# Patient Record
Sex: Female | Born: 1950 | Race: White | Hispanic: No | Smoking: Never smoker
Health system: Southern US, Community
[De-identification: ages and names within clinical notes are randomized; demographics above are authoritative.]

## PROBLEM LIST (undated history)

## (undated) DIAGNOSIS — G709 Myoneural disorder, unspecified: Secondary | ICD-10-CM

## (undated) DIAGNOSIS — F32A Depression, unspecified: Secondary | ICD-10-CM

## (undated) DIAGNOSIS — M1990 Unspecified osteoarthritis, unspecified site: Secondary | ICD-10-CM

## (undated) DIAGNOSIS — F329 Major depressive disorder, single episode, unspecified: Secondary | ICD-10-CM

## (undated) DIAGNOSIS — T7840XA Allergy, unspecified, initial encounter: Secondary | ICD-10-CM

## (undated) DIAGNOSIS — R51 Headache: Secondary | ICD-10-CM

## (undated) DIAGNOSIS — R519 Headache, unspecified: Secondary | ICD-10-CM

## (undated) DIAGNOSIS — M810 Age-related osteoporosis without current pathological fracture: Secondary | ICD-10-CM

## (undated) DIAGNOSIS — M8580 Other specified disorders of bone density and structure, unspecified site: Secondary | ICD-10-CM

## (undated) HISTORY — DX: Major depressive disorder, single episode, unspecified: F32.9

## (undated) HISTORY — DX: Depression, unspecified: F32.A

## (undated) HISTORY — PX: MANDIBLE SURGERY: SHX707

## (undated) HISTORY — DX: Age-related osteoporosis without current pathological fracture: M81.0

## (undated) HISTORY — PX: COLONOSCOPY: SHX174

## (undated) HISTORY — DX: Allergy, unspecified, initial encounter: T78.40XA

## (undated) HISTORY — DX: Headache: R51

## (undated) HISTORY — DX: Headache, unspecified: R51.9

## (undated) HISTORY — DX: Unspecified osteoarthritis, unspecified site: M19.90

## (undated) HISTORY — DX: Myoneural disorder, unspecified: G70.9

---

## 1996-04-15 HISTORY — PX: OTHER SURGICAL HISTORY: SHX169

## 2013-12-17 ENCOUNTER — Other Ambulatory Visit: Payer: Self-pay | Admitting: Physician Assistant

## 2013-12-21 ENCOUNTER — Other Ambulatory Visit: Payer: Self-pay | Admitting: Physician Assistant

## 2013-12-21 DIAGNOSIS — Z1231 Encounter for screening mammogram for malignant neoplasm of breast: Secondary | ICD-10-CM

## 2013-12-31 ENCOUNTER — Ambulatory Visit
Admission: RE | Admit: 2013-12-31 | Discharge: 2013-12-31 | Disposition: A | Discharge status: Home / Self Care | Payer: BC Managed Care – PPO | Source: Ambulatory Visit | Attending: Physician Assistant | Admitting: Physician Assistant

## 2013-12-31 DIAGNOSIS — Z1231 Encounter for screening mammogram for malignant neoplasm of breast: Secondary | ICD-10-CM

## 2016-02-26 ENCOUNTER — Other Ambulatory Visit: Payer: Self-pay | Admitting: Neurosurgery

## 2016-02-26 DIAGNOSIS — Z1231 Encounter for screening mammogram for malignant neoplasm of breast: Secondary | ICD-10-CM

## 2016-03-26 ENCOUNTER — Ambulatory Visit
Admission: RE | Admit: 2016-03-26 | Discharge: 2016-03-26 | Disposition: A | Discharge status: Home / Self Care | Payer: Medicare Other | Source: Ambulatory Visit | Attending: Neurosurgery | Admitting: Neurosurgery

## 2016-03-26 DIAGNOSIS — Z1231 Encounter for screening mammogram for malignant neoplasm of breast: Secondary | ICD-10-CM

## 2016-11-17 DIAGNOSIS — J3089 Other allergic rhinitis: Secondary | ICD-10-CM | POA: Diagnosis not present

## 2016-11-17 DIAGNOSIS — J029 Acute pharyngitis, unspecified: Secondary | ICD-10-CM | POA: Diagnosis not present

## 2017-01-11 DIAGNOSIS — Z23 Encounter for immunization: Secondary | ICD-10-CM | POA: Diagnosis not present

## 2017-03-17 DIAGNOSIS — H16223 Keratoconjunctivitis sicca, not specified as Sjogren's, bilateral: Secondary | ICD-10-CM | POA: Diagnosis not present

## 2017-03-20 ENCOUNTER — Encounter: Payer: Self-pay | Admitting: Family Medicine

## 2017-03-20 ENCOUNTER — Ambulatory Visit (INDEPENDENT_AMBULATORY_CARE_PROVIDER_SITE_OTHER): Payer: Medicare Other | Admitting: Family Medicine

## 2017-03-20 VITALS — BP 108/72 | HR 95 | Temp 97.8°F | Ht 68.0 in | Wt 163.5 lb

## 2017-03-20 DIAGNOSIS — Z23 Encounter for immunization: Secondary | ICD-10-CM

## 2017-03-20 DIAGNOSIS — Z1231 Encounter for screening mammogram for malignant neoplasm of breast: Secondary | ICD-10-CM | POA: Diagnosis not present

## 2017-03-20 DIAGNOSIS — Z1239 Encounter for other screening for malignant neoplasm of breast: Secondary | ICD-10-CM

## 2017-03-20 DIAGNOSIS — F339 Major depressive disorder, recurrent, unspecified: Secondary | ICD-10-CM | POA: Diagnosis not present

## 2017-03-20 MED ORDER — VENLAFAXINE HCL ER 150 MG PO CP24: 150.0000 mg | ORAL_CAPSULE | Freq: Every day | ORAL | 3 refills | Status: DC

## 2017-03-20 NOTE — Progress Notes (Signed)
Chief Complaint  Patient presents with  . Establish Care       New Patient Visit SUBJECTIVE: HPI: Paige Patel is an 66 y.o.female who is being seen for establishing care.  The patient was previously seen at [facility] to consolidate at [facility].  The patient has a history of depression.  She is currently being treated with Effexor XR 150 mg daily.  She is compliant and tolerating the medicine well.  She is try to go off the past, however he did not feel very good and went back on it.  She does not wish to go off of it in the future.  No family history of anxiety or depression.  She has a history of collagenous colitis.  She is to see a specialist for this, however she reports she was just given long courses of steroids that she did not feel helped her symptoms.  She has never had a pneumonia vaccine.  She did get a flu vaccine this year.  Her last mammogram was over a year ago.   Past Medical History:  Diagnosis Date  . Allergy   . Depression   . Frequent headaches    Past Surgical History:  Procedure Laterality Date  . OTHER SURGICAL HISTORY  1998   jaw surgery, balanced it   Social History   Socioeconomic History  . Marital status: Married  Tobacco Use  . Smoking status: Never Smoker  . Smokeless tobacco: Never Used  Substance and Sexual Activity  . Alcohol use: Yes    Frequency: Never    Comment: seldom  . Drug use: No   Family History  Problem Relation Age of Onset  . Alzheimer's disease Mother 34  . Cancer Father        Lung  . Hypertension Brother      Current Outpatient Medications:  .  diphenhydrAMINE (BENADRYL) 25 mg capsule, Take 25 mg by mouth at bedtime., Disp: , Rfl:  .  methocarbamol (ROBAXIN) 500 MG tablet, Take 500 mg by mouth as needed for muscle spasms., Disp: , Rfl:  .  omeprazole (PRILOSEC) 10 MG capsule, Take 10 mg by mouth daily., Disp: , Rfl:  .  venlafaxine XR (EFFEXOR-XR) 150 MG 24 hr capsule, Take 1 capsule (150 mg total) by mouth daily  with breakfast., Disp: 90 capsule, Rfl: 3  ROS Cardiovascular: Denies chest pain  Respiratory: Denies dyspnea   OBJECTIVE: BP 108/72 (BP Location: Right Leg, Patient Position: Sitting, Cuff Size: Normal)   Pulse 95   Temp 97.8 F (36.6 C) (Oral)   Ht 5\' 8"  (1.727 m)   Wt 163 lb 8 oz (74.2 kg)   SpO2 98%   BMI 24.86 kg/m   Constitutional: -  VS reviewed -  Well developed, well nourished, appears stated age -  No apparent distress  Psychiatric: -  Oriented to person, place, and time -  Memory intact -  Affect and mood normal -  Fluent conversation, good eye contact -  Judgment and insight age appropriate  Eye: -  Conjunctivae clear, no discharge -  Pupils symmetric, round, reactive to light  ENMT: -  MMM    Pharynx moist, no exudate, no erythema  Neck: -  No gross swelling, no palpable masses -  Thyroid midline, not enlarged, mobile, no palpable masses  Cardiovascular: -  RRR -  No LE edema  Respiratory: -  Normal respiratory effort, no accessory muscle use, no retraction -  Breath sounds equal, no wheezes, no ronchi, no crackles  Musculoskeletal: -  No clubbing, no cyanosis -  Gait normal  Skin: -  No significant lesion on inspection -  Warm and dry to palpation   ASSESSMENT/PLAN: Depression, recurrent (HCC) - Plan: venlafaxine XR (EFFEXOR-XR) 150 MG 24 hr capsule  Screening for breast cancer - Plan: MM DIGITAL SCREENING BILATERAL  Need for vaccination against Streptococcus pneumoniae - Plan: Pneumococcal conjugate vaccine 13-valent  Patient instructed to sign release of records form from her previous PCP. Refill Effexor. Mammogram. First pneumococcal vaccine given.  Next one in 1 year. Patient should return in around 1 mo for CPE, fasting. The patient voiced understanding and agreement to the plan.   [place], DO 03/20/17  9:55 AM

## 2017-03-20 NOTE — Progress Notes (Signed)
Pre visit review using our clinic review tool, if applicable. No additional management support is needed unless otherwise documented below in the visit note. 

## 2017-03-20 NOTE — Patient Instructions (Addendum)
I do not hear a heart murmur.  If you do not hear anything about your mammogram in the next week, call our office and ask for an update.  Come to your next appointment fasting for 9-12 hours for lab work.  Let us know if you need anything.

## 2017-05-07 ENCOUNTER — Ambulatory Visit (INDEPENDENT_AMBULATORY_CARE_PROVIDER_SITE_OTHER): Payer: Medicare Other | Admitting: Family Medicine

## 2017-05-07 ENCOUNTER — Encounter: Payer: Self-pay | Admitting: Family Medicine

## 2017-05-07 VITALS — BP 110/72 | HR 78 | Temp 97.6°F | Ht 68.0 in | Wt 161.2 lb

## 2017-05-07 DIAGNOSIS — Z1159 Encounter for screening for other viral diseases: Secondary | ICD-10-CM | POA: Diagnosis not present

## 2017-05-07 DIAGNOSIS — F329 Major depressive disorder, single episode, unspecified: Secondary | ICD-10-CM

## 2017-05-07 DIAGNOSIS — R61 Generalized hyperhidrosis: Secondary | ICD-10-CM

## 2017-05-07 DIAGNOSIS — Z0000 Encounter for general adult medical examination without abnormal findings: Secondary | ICD-10-CM | POA: Diagnosis not present

## 2017-05-07 DIAGNOSIS — F32A Depression, unspecified: Secondary | ICD-10-CM

## 2017-05-07 DIAGNOSIS — Z23 Encounter for immunization: Secondary | ICD-10-CM

## 2017-05-07 DIAGNOSIS — E785 Hyperlipidemia, unspecified: Secondary | ICD-10-CM | POA: Diagnosis not present

## 2017-05-07 DIAGNOSIS — E2839 Other primary ovarian failure: Secondary | ICD-10-CM | POA: Diagnosis not present

## 2017-05-07 DIAGNOSIS — M722 Plantar fascial fibromatosis: Secondary | ICD-10-CM | POA: Diagnosis not present

## 2017-05-07 LAB — COMPREHENSIVE METABOLIC PANEL
ALBUMIN: 4.7 g/dL (ref 3.5–5.2)
ALK PHOS: 105 U/L (ref 39–117)
ALT: 14 U/L (ref 0–35)
AST: 15 U/L (ref 0–37)
BUN: 12 mg/dL (ref 6–23)
CALCIUM: 10 mg/dL (ref 8.4–10.5)
CO2: 22 mEq/L (ref 19–32)
Chloride: 104 mEq/L (ref 96–112)
Creatinine, Ser: 0.91 mg/dL (ref 0.40–1.20)
GFR: 65.71 mL/min (ref >60.00)
Glucose, Bld: 95 mg/dL (ref 70–99)
POTASSIUM: 4.2 meq/L (ref 3.5–5.1)
Sodium: 140 mEq/L (ref 135–145)
Total Bilirubin: 0.5 mg/dL (ref 0.2–1.2)
Total Protein: 7.6 g/dL (ref 6.0–8.3)

## 2017-05-07 LAB — LIPID PANEL
CHOLESTEROL: 299 mg/dL — AB (ref 0–200)
HDL: 66.5 mg/dL (ref >39.00)
NonHDL: 232.08
TRIGLYCERIDES: 228 mg/dL — AB (ref 0.0–149.0)
Total CHOL/HDL Ratio: 4
VLDL: 45.6 mg/dL — AB (ref 0.0–40.0)

## 2017-05-07 LAB — LDL CHOLESTEROL, DIRECT: Direct LDL: 206 mg/dL

## 2017-05-07 NOTE — Patient Instructions (Addendum)
Try Drysol on the areas that are sweaty. If you are not better, let me know and we will send you to the dermatology team.   If your foot is not better, I want to see you in the office.  If you do not hear anything about your bone density scan in the next 1-2 weeks, call our office and ask for an update.   Plantar Fasciitis Stretches/exercises Do exercises exactly as told by your health care provider and adjust them as directed. It is normal to feel mild stretching, pulling, tightness, or discomfort as you do these exercises, but you should stop right away if you feel sudden pain or your pain gets worse.   Stretching and range of motion exercises These exercises warm up your muscles and joints and improve the movement and flexibility of your foot. These exercises also help to relieve pain.  Exercise A: Plantar fascia stretch 1. Sit with your left / right leg crossed over your opposite knee. 2. Hold your heel with one hand with that thumb near your arch. With your other hand, hold your toes and gently pull them back toward the top of your foot. You should feel a stretch on the bottom of your toes or your foot or both. 3. Hold this stretch for 30 seconds. 4. Slowly release your toes and return to the starting position. Repeat 2 times. Complete this exercise 3 times per week.  Exercise B: Gastroc, standing 1. Stand with your hands against a wall. 2. Extend your left / right leg behind you, and bend your front knee slightly. 3. Keeping your heels on the floor and keeping your back knee straight, shift your weight toward the wall without arching your back. You should feel a gentle stretch in your left / right calf. 4. Hold this position for 30 seconds. Repeat 2 times. Complete this exercise 3 times a week. Exercise C: Soleus, standing 1. Stand with your hands against a wall. 2. Extend your left / right leg behind you, and bend your front knee slightly. 3. Keeping your heels on the floor, bend  your back knee and slightly shift your weight over the back leg. You should feel a gentle stretch deep in your calf. 4. Hold this position for 30 seconds. Repeat 2 times. Complete this exercise 3 times per week. Exercise D: Gastrocsoleus, standing 1. Stand with the ball of your left / right foot on a step. The ball of your foot is on the walking surface, right under your toes. 2. Keep your other foot firmly on the same step. 3. Hold onto the wall or a railing for balance. 4. Slowly lift your other foot, allowing your body weight to press your heel down over the edge of the step. You should feel a stretch in your left / right calf. 5. Hold this position for 30 seconds. 6. Return both feet to the step. 7. Repeat this exercise with a slight bend in your left / right knee. Repeat 2 times with your left / right knee straight and 2times with your left / right knee bent. Complete this exercise 3 times a week.  Balance exercise This exercise builds your balance and strength control of your arch to help take pressure off your plantar fascia. Exercise E: Single leg stand 1. Without shoes, stand near a railing or in a doorway. You may hold onto the railing or door frame as needed. 2. Stand on your left / right foot. Keep your big toe down on  the floor and try to keep your arch lifted. Do not let your foot roll inward. 3. Hold this position for 30 seconds. 4. If this exercise is too easy, you can try it with your eyes closed or while standing on a pillow. Repeat 2 times. Complete this exercise 3 times per week. This information is not intended to replace advice given to you by your health care provider. Make sure you discuss any questions you have with your health care provider. Document Released: 04/01/2005 Document Revised: 12/05/2015 Document Reviewed: 02/13/2015 Elsevier Interactive Patient Education  2017 [employer].

## 2017-05-07 NOTE — Progress Notes (Signed)
Subjective:    Paige Patel is a 67 y.o. female who presents for Medicare Initial preventive examination.  Profusely sweats around forehead. Keeps temp around 65 F at home. No other areas of excessive sweat. Has not tried any therapy thus far otherwise.  +1 yr hx of R heel pain. Thinks she may have a spur. Does some exercises at home. Worse after she walks for a period of time. She has supportive shoes.   Preventive Screening-Counseling & Management  Tobacco Social History   Tobacco Use  Smoking Status Never Smoker  Smokeless Tobacco Never Used     Problems Prior to Visit 1. Depression 2. Hyperlipidemia  Current Problems (verified) Patient Active Problem List   Diagnosis Date Noted  . Estrogen deficiency 05/07/2017  . Depression     Medications Prior to Visit Current Outpatient Medications on File Prior to Visit  Medication Sig Dispense Refill  . diphenhydrAMINE (BENADRYL) 25 mg capsule Take 25 mg by mouth at bedtime.    . methocarbamol (ROBAXIN) 500 MG tablet Take 500 mg by mouth as needed for muscle spasms.    Marland Kitchen omeprazole (PRILOSEC) 10 MG capsule Take 10 mg by mouth daily.    Marland Kitchen venlafaxine XR (EFFEXOR-XR) 150 MG 24 hr capsule Take 1 capsule (150 mg total) by mouth daily with breakfast. 90 capsule 3   Current Medications (verified) Current Outpatient Medications  Medication Sig Dispense Refill  . diphenhydrAMINE (BENADRYL) 25 mg capsule Take 25 mg by mouth at bedtime.    . methocarbamol (ROBAXIN) 500 MG tablet Take 500 mg by mouth as needed for muscle spasms.    Marland Kitchen omeprazole (PRILOSEC) 10 MG capsule Take 10 mg by mouth daily.    Marland Kitchen venlafaxine XR (EFFEXOR-XR) 150 MG 24 hr capsule Take 1 capsule (150 mg total) by mouth daily with breakfast. 90 capsule 3   Allergies (verified) Patient has no allergy information on record.   PAST HISTORY  Family History Family History  Problem Relation Age of Onset  . Alzheimer's disease Mother 10  . Cancer Father        Lung  .  Hypertension Brother    Social History Social History   Tobacco Use  . Smoking status: Never Smoker  . Smokeless tobacco: Never Used  Substance Use Topics  . Alcohol use: Yes    Frequency: Never    Comment: seldom     Are there smokers in your home (other than you)? No  Risk Factors Current exercise habits: walking, physically active at work  Dietary issues discussed: Yes  Cardiac risk factors: advanced age (older than 75 for men, 40 for women) and dyslipidemia.  Depression Screen (Note: if answer to either of the following is "Yes", a more complete depression screening is indicated)   Over the past 2 weeks, have you felt down, depressed or hopeless? No  Over the past 2 weeks, have you felt little interest or pleasure in doing things? No  Have you lost interest or pleasure in daily life? No  Do you often feel hopeless? No  Do you cry easily over simple problems? No  Activities of Daily Living In your present state of health, do you have any difficulty performing the following activities?:  Driving? No Managing money?  No Feeding yourself? No Getting from bed to chair? No Climbing a flight of stairs? No Preparing food and eating?: No Bathing or showering? No Getting dressed: No Getting to the toilet? No Using the toilet:No Moving around from place to place: No  In the past year have you fallen or had a near fall?:No  Are you sexually active?  No  Do you have more than one partner?  No  Hearing Difficulties: No Do you often ask people to speak up or repeat themselves? No Do you experience ringing or noises in your ears? No Do you have difficulty understanding soft or whispered voices? No  Do you feel that you have a problem with memory? No  Do you often misplace items? Yes  Do you feel safe at home?  Yes  Cognitive Testing  Alert? Yes  Normal Appearance?Yes  Oriented to person? Yes  Place? Yes   Time? Yes  Recall of three objects?  Yes  Can perform simple  calculations? Yes  Displays appropriate judgment?Yes  Can read the correct time from a watch face?Yes   Advanced Directives have been discussed with the patient? Yes  List the Names of Other Physician/Practitioners you currently use: 1.  None  Indicate any recent Medical Services you may have received from other than [facility] providers in the past year (date may be approximate).  Immunization History  Administered Date(s) Administered  . Pneumococcal Conjugate-13 03/20/2017  . Td 05/07/2017    Screening Tests Health Maintenance  Topic Date Due  . Hepatitis C Screening  1950-11-12  . TETANUS/TDAP  03/18/1970  . COLONOSCOPY  03/18/2001  . DEXA SCAN  03/18/2016  . PNA vac Low Risk Adult (2 of 2 - PPSV23) 03/20/2018  . MAMMOGRAM  03/26/2018  . INFLUENZA VACCINE  Completed    All answers were reviewed with the patient and necessary referrals were made:  Paige Pal, DO   05/07/2017   History reviewed: allergies, current medications, past family history, past medical history, past social history, past surgical history and problem list  Review of Systems ROS neg except for following   Skin: +itchy area on back intermittently MSK: +TTP over R foot   Objective:     Vision by Snellen chart: right eye:20/20, left eye:20/20  Body mass index is 24.52 kg/m. BP 110/72 (BP Location: Left Arm, Patient Position: Sitting, Cuff Size: Normal)   Pulse 78   Temp 97.6 F (36.4 C) (Oral)   Ht 5\' 8"  (1.727 m)   Wt 161 lb 4 oz (73.1 kg)   SpO2 98%   BMI 24.52 kg/m   General Appearance:    Alert, cooperative, no distress, appears stated age  Head:    Normocephalic, without obvious abnormality, atraumatic  Eyes:    PERRL, conjunctiva/corneas clear, EOM's intact, fundi    benign, both eyes  Ears:    Normal TM's and external ear canals, both ears  Nose:   Nares normal, septum midline, mucosa normal, no drainage    or sinus tenderness  Throat:   Lips, mucosa, and tongue normal;  teeth and gums normal  Neck:   Supple, symmetrical, trachea midline, no adenopathy;    thyroid:  no enlargement/tenderness/nodules; no carotid   bruit or JVD  Back:     Symmetric, no curvature, ROM normal, no CVA tenderness  Lungs:     Clear to auscultation bilaterally, respirations unlabored  Chest Wall:    No tenderness or deformity   Heart:    Regular rate and rhythm, S1 and S2 normal, no murmur, rub   or gallop     Abdomen:     Soft, non-tender, bowel sounds active all four quadrants,    no masses, no organomegaly  Genitalia:    Deferred  Rectal:  Not done  Extremities:   Extremities normal, atraumatic, no cyanosis or edema; +TTP over prox insertion of R plantar fascia  Pulses:   2+ and symmetric all extremities  Skin:   Skin color, texture, turgor normal, no rashes or lesions  Lymph nodes:   Cervical, supraclavicular, and axillary nodes normal  Neurologic:   CNII-XII intact, normal strength, sensation and reflexes    throughout        Assessment:     Medicare annual wellness visit, initial  Depression, unspecified depression type  Encounter for hepatitis C screening test for low risk patient - Plan: Hepatitis C antibody  Estrogen deficiency - Plan: DG Bone Density  Dyslipidemia - Plan: Comprehensive metabolic panel, Lipid panel  Plantar fasciitis  Hyperhidrosis  Need for tetanus booster - Plan: Td vaccine greater than or equal to 7yo preservative free IM       Plan:      Hyperhidrosis: Try Drysol, if no better will refer to derm Plantar fasciitis: [place] sock, Power Step, Home stretches/exercises; if not better, let us know  During the course of the visit the patient was educated and counseled about appropriate screening and preventive services including:    Pneumococcal vaccine   Influenza vaccine  Td vaccine  Screening mammography  Bone densitometry screening  Colorectal cancer screening  Shingles vaccine  Diet review for nutrition  referral? Not Indicated    Patient Instructions (the written plan) was given to the patient.  Medicare Attestation I have personally reviewed: The patient's medical and social history Their use of alcohol, tobacco or illicit drugs Their current medications and supplements The patient's functional ability including ADLs,fall risks, home safety risks, cognitive, and hearing and visual impairment Diet and physical activities Evidence for depression or mood disorders  The patient's weight, height, BMI, and visual acuity have been recorded in the chart.  I have made referrals, counseling, and provided education to the patient based on review of the above and I have provided the patient with a written personalized care plan for preventive services.     [place], DO   05/07/2017

## 2017-05-07 NOTE — Progress Notes (Signed)
Pre visit review using our clinic review tool, if applicable. No additional management support is needed unless otherwise documented below in the visit note. 

## 2017-05-08 ENCOUNTER — Other Ambulatory Visit: Payer: Self-pay | Admitting: Family Medicine

## 2017-05-08 LAB — HEPATITIS C ANTIBODY
Hepatitis C Ab: NONREACTIVE
SIGNAL TO CUT-OFF: 0.01 (ref <1.00)

## 2017-05-08 MED ORDER — ATORVASTATIN CALCIUM 40 MG PO TABS: 40.0000 mg | ORAL_TABLET | Freq: Every day | ORAL | 1 refills | Status: DC

## 2017-05-09 ENCOUNTER — Ambulatory Visit (HOSPITAL_BASED_OUTPATIENT_CLINIC_OR_DEPARTMENT_OTHER)
Admission: RE | Admit: 2017-05-09 | Discharge: 2017-05-09 | Disposition: A | Discharge status: Home / Self Care | Payer: Medicare Other | Source: Ambulatory Visit | Attending: Family Medicine | Admitting: Family Medicine

## 2017-05-09 ENCOUNTER — Encounter (HOSPITAL_BASED_OUTPATIENT_CLINIC_OR_DEPARTMENT_OTHER): Payer: Self-pay | Admitting: Radiology

## 2017-05-09 ENCOUNTER — Encounter: Payer: Self-pay | Admitting: Family Medicine

## 2017-05-09 DIAGNOSIS — Z1239 Encounter for other screening for malignant neoplasm of breast: Secondary | ICD-10-CM

## 2017-05-09 DIAGNOSIS — Z780 Asymptomatic menopausal state: Secondary | ICD-10-CM | POA: Diagnosis not present

## 2017-05-09 DIAGNOSIS — M85852 Other specified disorders of bone density and structure, left thigh: Secondary | ICD-10-CM | POA: Insufficient documentation

## 2017-05-09 DIAGNOSIS — E2839 Other primary ovarian failure: Secondary | ICD-10-CM | POA: Insufficient documentation

## 2017-05-09 DIAGNOSIS — M8588 Other specified disorders of bone density and structure, other site: Secondary | ICD-10-CM | POA: Insufficient documentation

## 2017-05-09 DIAGNOSIS — Z1231 Encounter for screening mammogram for malignant neoplasm of breast: Secondary | ICD-10-CM | POA: Diagnosis not present

## 2017-05-09 DIAGNOSIS — M8589 Other specified disorders of bone density and structure, multiple sites: Secondary | ICD-10-CM | POA: Diagnosis not present

## 2017-05-09 DIAGNOSIS — M8580 Other specified disorders of bone density and structure, unspecified site: Secondary | ICD-10-CM | POA: Insufficient documentation

## 2017-05-09 HISTORY — DX: Other specified disorders of bone density and structure, unspecified site: M85.80

## 2017-05-12 ENCOUNTER — Other Ambulatory Visit: Payer: Self-pay | Admitting: Family Medicine

## 2017-05-12 DIAGNOSIS — R928 Other abnormal and inconclusive findings on diagnostic imaging of breast: Secondary | ICD-10-CM

## 2017-05-15 ENCOUNTER — Telehealth: Payer: Self-pay | Admitting: *Deleted

## 2017-05-15 NOTE — Telephone Encounter (Signed)
Received Physician Orders from [place]; forwarded to provider/SLS 01/31

## 2017-05-16 ENCOUNTER — Ambulatory Visit
Admission: RE | Admit: 2017-05-16 | Discharge: 2017-05-16 | Disposition: A | Discharge status: Home / Self Care | Payer: Medicare Other | Source: Ambulatory Visit | Attending: Family Medicine | Admitting: Family Medicine

## 2017-05-16 ENCOUNTER — Ambulatory Visit: Payer: Medicare Other

## 2017-05-16 DIAGNOSIS — N6489 Other specified disorders of breast: Secondary | ICD-10-CM | POA: Diagnosis not present

## 2017-05-16 DIAGNOSIS — R928 Other abnormal and inconclusive findings on diagnostic imaging of breast: Secondary | ICD-10-CM | POA: Diagnosis not present

## 2017-07-10 ENCOUNTER — Encounter: Payer: Self-pay | Admitting: Family Medicine

## 2017-07-11 MED ORDER — METHOCARBAMOL 500 MG PO TABS: 500.0000 mg | ORAL_TABLET | ORAL | 1 refills | Status: DC | PRN

## 2017-10-12 ENCOUNTER — Other Ambulatory Visit: Payer: Self-pay | Admitting: Family Medicine

## 2017-11-12 ENCOUNTER — Encounter: Payer: Self-pay | Admitting: Family Medicine

## 2017-11-12 ENCOUNTER — Ambulatory Visit: Payer: Medicare Other | Admitting: Family Medicine

## 2017-11-12 ENCOUNTER — Other Ambulatory Visit: Payer: Self-pay | Admitting: Family Medicine

## 2017-11-12 VITALS — BP 118/78 | HR 78 | Temp 98.1°F | Ht 67.5 in | Wt 163.0 lb

## 2017-11-12 DIAGNOSIS — F329 Major depressive disorder, single episode, unspecified: Secondary | ICD-10-CM

## 2017-11-12 DIAGNOSIS — E785 Hyperlipidemia, unspecified: Secondary | ICD-10-CM

## 2017-11-12 DIAGNOSIS — R748 Abnormal levels of other serum enzymes: Secondary | ICD-10-CM

## 2017-11-12 DIAGNOSIS — Z23 Encounter for immunization: Secondary | ICD-10-CM

## 2017-11-12 DIAGNOSIS — F32A Depression, unspecified: Secondary | ICD-10-CM

## 2017-11-12 LAB — LIPID PANEL
CHOL/HDL RATIO: 4
Cholesterol: 272 mg/dL — ABNORMAL HIGH (ref 0–200)
HDL: 73.5 mg/dL (ref >39.00)
LDL CALC: 162 mg/dL — AB (ref 0–99)
NonHDL: 198.14
TRIGLYCERIDES: 179 mg/dL — AB (ref 0.0–149.0)
VLDL: 35.8 mg/dL (ref 0.0–40.0)

## 2017-11-12 LAB — COMPREHENSIVE METABOLIC PANEL
ALT: 16 U/L (ref 0–35)
AST: 18 U/L (ref 0–37)
Albumin: 4.4 g/dL (ref 3.5–5.2)
Alkaline Phosphatase: 125 U/L — ABNORMAL HIGH (ref 39–117)
BUN: 10 mg/dL (ref 6–23)
CALCIUM: 9.7 mg/dL (ref 8.4–10.5)
CO2: 28 meq/L (ref 19–32)
Chloride: 103 mEq/L (ref 96–112)
Creatinine, Ser: 0.85 mg/dL (ref 0.40–1.20)
GFR: 70.98 mL/min (ref >60.00)
Glucose, Bld: 94 mg/dL (ref 70–99)
Potassium: 4.3 mEq/L (ref 3.5–5.1)
Sodium: 139 mEq/L (ref 135–145)
Total Bilirubin: 0.6 mg/dL (ref 0.2–1.2)
Total Protein: 6.8 g/dL (ref 6.0–8.3)

## 2017-11-12 MED ORDER — ROSUVASTATIN CALCIUM 20 MG PO TABS: 20.0000 mg | ORAL_TABLET | Freq: Every day | ORAL | 3 refills | Status: DC

## 2017-11-12 NOTE — Patient Instructions (Signed)
Keep the diet clean and stay active.  Give us 2-3 business days to get the results of your labs back.   Let us know if you need anything.  

## 2017-11-12 NOTE — Progress Notes (Signed)
Chief Complaint  Patient presents with  . Follow-up    Subjective Paige Patel presents for f/u anxiety/depression.  She is currently being treated with Effexor.  Reports doing well since treatment. No thoughts of harming self or others. No self-medication with alcohol, prescription drugs or illicit drugs.  Hyperlipidemia Dx with LDL >190 on labs, started on Lipitor 40 mg/d. Unable to take 2/2 AE's. Diet is healthy. Walks daily. No CP or SOB. +famhx of increased cholesterol.   ROS Psych: No homicidal or suicidal thoughts  Past Medical History:  Diagnosis Date  . Allergy   . Depression   . Frequent headaches   . Osteopenia      Exam BP 118/78 (BP Location: Left Arm, Patient Position: Sitting, Cuff Size: Normal)   Pulse 78   Temp 98.1 F (36.7 C) (Oral)   Ht 5' 7.5" (1.715 m)   Wt 163 lb (73.9 kg)   SpO2 97%   BMI 25.15 kg/m  General:  well developed, well nourished, in no apparent distress Neck: neck supple without adenopathy, thyromegaly, or masses Lungs:  clear to auscultation, breath sounds equal bilaterally, no respiratory distress Cardio:  regular rate and rhythm without murmurs, heart sounds without clicks or rubs Psych: well oriented with normal range of affect and age-appropriate judgement/insight, alert and oriented x4.  Assessment and Plan  Dyslipidemia - Plan: Comprehensive metabolic panel, Lipid panel  Depression, unspecified depression type  Need for shingles vaccine - Plan: Varicella-zoster vaccine IM (Shingrix)  Need for vaccination against Streptococcus pneumoniae - Plan: Pneumococcal conjugate vaccine 13-valent IM  Cont current meds. Will likely rec Pravachol vs Crestor. F/u in 6 mo. The patient voiced understanding and agreement to the plan.  [place], DO 11/12/17 9:34 AM

## 2017-11-12 NOTE — Progress Notes (Signed)
Pre visit review using our clinic review tool, if applicable. No additional management support is needed unless otherwise documented below in the visit note. 

## 2017-11-24 ENCOUNTER — Other Ambulatory Visit (INDEPENDENT_AMBULATORY_CARE_PROVIDER_SITE_OTHER): Payer: Medicare Other

## 2017-11-24 DIAGNOSIS — R748 Abnormal levels of other serum enzymes: Secondary | ICD-10-CM | POA: Diagnosis not present

## 2017-11-24 LAB — HEPATIC FUNCTION PANEL
ALT: 15 U/L (ref 0–35)
AST: 18 U/L (ref 0–37)
Albumin: 4.1 g/dL (ref 3.5–5.2)
Alkaline Phosphatase: 111 U/L (ref 39–117)
BILIRUBIN DIRECT: 0.1 mg/dL (ref 0.0–0.3)
BILIRUBIN TOTAL: 0.4 mg/dL (ref 0.2–1.2)
Total Protein: 6.4 g/dL (ref 6.0–8.3)

## 2017-12-14 ENCOUNTER — Other Ambulatory Visit: Payer: Self-pay | Admitting: Family Medicine

## 2018-02-17 ENCOUNTER — Ambulatory Visit (INDEPENDENT_AMBULATORY_CARE_PROVIDER_SITE_OTHER): Payer: Medicare Other

## 2018-02-17 DIAGNOSIS — Z23 Encounter for immunization: Secondary | ICD-10-CM | POA: Diagnosis not present

## 2018-03-06 ENCOUNTER — Other Ambulatory Visit: Payer: Self-pay | Admitting: Family Medicine

## 2018-03-06 DIAGNOSIS — F339 Major depressive disorder, recurrent, unspecified: Secondary | ICD-10-CM

## 2018-03-23 ENCOUNTER — Other Ambulatory Visit: Payer: Self-pay | Admitting: Family Medicine

## 2018-05-15 ENCOUNTER — Ambulatory Visit (INDEPENDENT_AMBULATORY_CARE_PROVIDER_SITE_OTHER): Payer: Medicare Other | Admitting: Family Medicine

## 2018-05-15 ENCOUNTER — Encounter: Payer: Self-pay | Admitting: Family Medicine

## 2018-05-15 VITALS — BP 102/68 | HR 88 | Temp 98.0°F | Ht 68.0 in | Wt 170.0 lb

## 2018-05-15 DIAGNOSIS — F329 Major depressive disorder, single episode, unspecified: Secondary | ICD-10-CM

## 2018-05-15 DIAGNOSIS — Z0000 Encounter for general adult medical examination without abnormal findings: Secondary | ICD-10-CM | POA: Diagnosis not present

## 2018-05-15 DIAGNOSIS — Z1211 Encounter for screening for malignant neoplasm of colon: Secondary | ICD-10-CM

## 2018-05-15 DIAGNOSIS — F32A Depression, unspecified: Secondary | ICD-10-CM

## 2018-05-15 DIAGNOSIS — E785 Hyperlipidemia, unspecified: Secondary | ICD-10-CM

## 2018-05-15 LAB — COMPREHENSIVE METABOLIC PANEL
ALK PHOS: 102 U/L (ref 39–117)
ALT: 16 U/L (ref 0–35)
AST: 17 U/L (ref 0–37)
Albumin: 4.3 g/dL (ref 3.5–5.2)
BUN: 13 mg/dL (ref 6–23)
CALCIUM: 9.8 mg/dL (ref 8.4–10.5)
CO2: 25 meq/L (ref 19–32)
Chloride: 103 mEq/L (ref 96–112)
Creatinine, Ser: 0.8 mg/dL (ref 0.40–1.20)
GFR: 71.51 mL/min (ref >60.00)
GLUCOSE: 91 mg/dL (ref 70–99)
POTASSIUM: 4.3 meq/L (ref 3.5–5.1)
Sodium: 138 mEq/L (ref 135–145)
Total Bilirubin: 0.7 mg/dL (ref 0.2–1.2)
Total Protein: 6.4 g/dL (ref 6.0–8.3)

## 2018-05-15 LAB — LIPID PANEL
Cholesterol: 163 mg/dL (ref 0–200)
HDL: 57.3 mg/dL (ref >39.00)
LDL Cholesterol: 73 mg/dL (ref 0–99)
NonHDL: 105.21
TRIGLYCERIDES: 162 mg/dL — AB (ref 0.0–149.0)
Total CHOL/HDL Ratio: 3
VLDL: 32.4 mg/dL (ref 0.0–40.0)

## 2018-05-15 MED ORDER — METHOCARBAMOL 500 MG PO TABS: 500.0000 mg | ORAL_TABLET | ORAL | 1 refills | Status: DC | PRN

## 2018-05-15 NOTE — Progress Notes (Signed)
Subjective:   Paige Patel is a 68 y.o. female who presents for Medicare Annual (Subsequent) preventive examination.  Hyperlipidemia Patient presents for dyslipidemia follow up. Currently being treated with Crestor 20 mg/d and compliance with treatment thus far has been good. She denies myalgias. She is adhering to a healthy. Exercise: walking, active at home The patient is not known to have coexisting coronary artery disease.  Depression Currently doing well on venlafaxine, no AE's. Not following with counselor or psychologist. It also helps with her vertigo.   Review of Systems:  MSK: +intermittent toe pain Const: No fevers Endo: slight wt gain GU: No urinary complaints Skin: No rashes Neuro: No vertigo Psych: No depression Lungs: No shortness of breath Cardiac: No chest pain GI: No bowel changes       Objective:     Vitals: BP 102/68 (BP Location: Left Arm, Patient Position: Sitting, Cuff Size: Normal)   Pulse 88   Temp 98 F (36.7 C) (Oral)   Ht 5\' 8"  (1.727 m)   Wt 170 lb (77.1 kg)   SpO2 98%   BMI 25.85 kg/m   Body mass index is 25.85 kg/m.   Tobacco Social History   Tobacco Use  Smoking Status Never Smoker  Smokeless Tobacco Never Used     Counseling given: Not Answered     Past Medical History:  Diagnosis Date  . Allergy   . Depression   . Frequent headaches   . Osteopenia    Past Surgical History:  Procedure Laterality Date  . OTHER SURGICAL HISTORY  1998   jaw surgery, balanced it   Family History  Problem Relation Age of Onset  . Alzheimer's disease Mother 29  . High Cholesterol Mother   . Cancer Father        Lung  . High Cholesterol Father   . Hypertension Brother   . Breast cancer Paternal Aunt      Outpatient Encounter Medications as of 05/15/2018  Medication Sig  . methocarbamol (ROBAXIN) 500 MG tablet Take 1 tablet (500 mg total) by mouth as needed for muscle spasms.  Marland Kitchen omeprazole (PRILOSEC) 10 MG capsule Take 10 mg  by mouth daily.  . rosuvastatin (CRESTOR) 20 MG tablet Take 1 tablet (20 mg total) by mouth daily.  Marland Kitchen venlafaxine XR (EFFEXOR-XR) 150 MG 24 hr capsule TAKE 1 CAPSULE DAILY WITH BREAKFAST    Activities of Daily Living No flowsheet data found.  Patient Care Team: [name], DO as PCP - General (Family Medicine)    Assessment:   This is a routine wellness examination for [name].  Exercise Activities and Dietary recommendations    Goals   None     Fall Risk Is the patient's home free of loose throw rugs in walkways, pet beds, electrical cords, etc?   no      Grab bars in the bathroom? no      Handrails on the stairs?   Doesn't need to      Adequate lighting?   yes  Timed Get Up and Go performed: 7 s  Depression Screen Controlled on medication  Cognitive Function A&O x 4  Immunization History  Administered Date(s) Administered  . Influenza, High Dose Seasonal PF 02/17/2018  . Pneumococcal Conjugate-13 03/20/2017, 11/12/2017  . Td 05/07/2017  . Zoster Recombinat (Shingrix) 11/12/2017, 02/27/2018    Qualifies for Shingles Vaccine? Yes  Screening Tests Health Maintenance  Topic Date Due  . COLONOSCOPY  03/18/2001  . PNA vac Low Risk Adult (  2 of 2 - PPSV23) 11/13/2018  . MAMMOGRAM  05/17/2019  . TETANUS/TDAP  05/08/2027  . INFLUENZA VACCINE  Completed  . DEXA SCAN  Completed  . Hepatitis C Screening  Completed    Cancer Screenings: Lung: Low Dose CT Chest recommended if Age 68-80 years, 30 pack-year currently smoking OR have quit w/in 15 years. Patient does not qualify. Breast:  Up to date on Mammogram? Yes   Up to date of Bone Density/Dexa? Yes Colorectal: Ordered  Additional Screenings:: Hepatitis C Screening: Done     Plan:  I have personally reviewed and noted the following in the patient's chart:   . Medical and social history . Use of alcohol, tobacco or illicit drugs  . Current medications and supplements . Functional ability and  status . Nutritional status . Physical activity . Advanced directives . List of other physicians . Hospitalizations, surgeries, and ER visits in previous 12 months . Vitals . Screenings to include cognitive, depression, and falls . Referrals and appointments  In addition, I have reviewed and discussed with patient certain preventive protocols, quality metrics, and best practice recommendations. A written personalized care plan for preventive services as well as general preventive health recommendations were provided to patient.     [place], DO  05/15/2018

## 2018-05-15 NOTE — Patient Instructions (Addendum)
If you do not hear anything about your referral in the next 1-2 weeks, call our office and ask for an update.  Give us 2-3 business days to get the results of your labs back.   Keep the diet clean and stay active.  Let us know if you need anything.  

## 2018-06-17 ENCOUNTER — Other Ambulatory Visit: Payer: Self-pay | Admitting: Family Medicine

## 2018-06-17 DIAGNOSIS — Z1231 Encounter for screening mammogram for malignant neoplasm of breast: Secondary | ICD-10-CM

## 2018-06-24 ENCOUNTER — Encounter: Payer: Self-pay | Admitting: Family Medicine

## 2018-08-11 ENCOUNTER — Ambulatory Visit: Payer: Medicare Other

## 2018-10-22 ENCOUNTER — Other Ambulatory Visit: Payer: Self-pay | Admitting: Family Medicine

## 2018-11-13 ENCOUNTER — Ambulatory Visit (INDEPENDENT_AMBULATORY_CARE_PROVIDER_SITE_OTHER): Payer: Medicare Other | Admitting: Family Medicine

## 2018-11-13 ENCOUNTER — Other Ambulatory Visit: Payer: Self-pay

## 2018-11-13 ENCOUNTER — Encounter: Payer: Self-pay | Admitting: Family Medicine

## 2018-11-13 VITALS — BP 120/80 | HR 84 | Temp 98.0°F | Ht 68.0 in | Wt 168.5 lb

## 2018-11-13 DIAGNOSIS — F32A Depression, unspecified: Secondary | ICD-10-CM

## 2018-11-13 DIAGNOSIS — E785 Hyperlipidemia, unspecified: Secondary | ICD-10-CM

## 2018-11-13 DIAGNOSIS — F329 Major depressive disorder, single episode, unspecified: Secondary | ICD-10-CM | POA: Diagnosis not present

## 2018-11-13 DIAGNOSIS — M792 Neuralgia and neuritis, unspecified: Secondary | ICD-10-CM

## 2018-11-13 MED ORDER — ROSUVASTATIN CALCIUM 20 MG PO TABS: 20.0000 mg | ORAL_TABLET | Freq: Every day | ORAL | 3 refills | Status: DC

## 2018-11-13 NOTE — Patient Instructions (Signed)
Keep the diet clean and stay active.  I think things are relatively OK with your feet for now.   If things bother you enough to start a medicine, let me know.  Let us know if you need anything.

## 2018-11-13 NOTE — Progress Notes (Signed)
Chief Complaint  Patient presents with  . Follow-up  . Foot Swelling    redness and painful    Subjective: Hyperlipidemia Patient presents for Hyperlipidemia follow up. Currently taking Crestor 20 mg/d and compliance with treatment thus far has been good. She denies myalgias. She is adhering to a healthy diet. Exercise: walking The patient is not known to have coexisting coronary artery disease.  Mood is doing well with the Effexor 150 mg/d. No AE's, actually does worse when she comes off. Compliant.  Had been having swelling/redness in feet for a period of time. 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 No pain with ambulation. No current swelling/redness. Feels things are better from that regard. No CP or SOB.   ROS: Heart: Denies chest pain Lungs: Denies SOB  Past Medical History:  Diagnosis Date  . Allergy   . Depression   . Frequent headaches   . Osteopenia     Objective: BP 120/80 (BP Location: Left Arm, Patient Position: Sitting, Cuff Size: Normal)   Pulse 84   Temp 98 F (36.7 C) (Oral)   Ht 5\' 8"  (1.727 m)   Wt 168 lb 8 oz (76.4 kg)   SpO2 98%   BMI 25.62 kg/m  General: Awake, appears stated age HEENT: MMM Heart: RRR, no LE edema, no bruits, DP pulses 1+ bl.  Neuro: Decreased sensation to pinprick to midfoot on L, up to hindfoot on R.  Skin: No lesions/erythema/execessive warmth.  Lungs: CTAB, no rales, wheezes or rhonchi. No accessory muscle use Psych: Age appropriate judgment and insight, normal affect and mood  Assessment and Plan: Dyslipidemia - Plan: Cont statin  Depression, unspecified depression type - Plan: Cont SNRI  Neuropathic pain - Plan: cont SNRI, offered new med but not bothersome enough. May have a component of venous insuff.  Orders as above. F/u in 6 mo for AWV and med ck. The patient voiced understanding and agreement to the plan.  [place], DO 11/13/18  8:22 AM

## 2019-01-27 ENCOUNTER — Ambulatory Visit (INDEPENDENT_AMBULATORY_CARE_PROVIDER_SITE_OTHER): Payer: Medicare Other | Admitting: *Deleted

## 2019-01-27 ENCOUNTER — Other Ambulatory Visit: Payer: Self-pay

## 2019-01-27 DIAGNOSIS — Z23 Encounter for immunization: Secondary | ICD-10-CM

## 2019-01-27 NOTE — Progress Notes (Signed)
Patient here for flu vaccine.  Vaccine given in left deltoid and patient tolerated well. 

## 2019-02-04 ENCOUNTER — Other Ambulatory Visit: Payer: Self-pay | Admitting: Family Medicine

## 2019-03-10 ENCOUNTER — Other Ambulatory Visit: Payer: Self-pay

## 2019-04-19 ENCOUNTER — Other Ambulatory Visit: Payer: Self-pay | Admitting: Family Medicine

## 2019-04-19 DIAGNOSIS — F339 Major depressive disorder, recurrent, unspecified: Secondary | ICD-10-CM

## 2019-05-14 NOTE — Progress Notes (Signed)
Virtual Visit via Video Note  I connected with patient on 05/17/19 at  8:00 AM EST by audio enabled telemedicine application and verified that I am speaking with the correct person using two identifiers.   THIS ENCOUNTER IS A VIRTUAL VISIT DUE TO COVID-19 - PATIENT WAS NOT SEEN IN THE OFFICE. PATIENT HAS CONSENTED TO VIRTUAL VISIT / TELEMEDICINE VISIT   Location of patient: home  Location of provider: office  I discussed the limitations of evaluation and management by telemedicine and the availability of in person appointments. The patient expressed understanding and agreed to proceed.   Subjective:   Paige Patel is a 69 y.o. female who presents for Medicare Annual (Subsequent) preventive examination.  Review of Systems:  Cardiac Risk Factors include: advanced age (>59men, >45 women);dyslipidemia Home Safety/Smoke Alarms: Feels safe in home. Smoke alarms in place.  Lives w/ husband and 2 dogs in 2 story home. No trouble w/ stairs but does have stair lift. Walk in shower w/ grab bar.    Female:        Mammo- active order       Dexa scan- ordered        CCS- pt reports she will be due in June 2021    Objective:     Vitals: Unable to assess. This visit is enabled though telemedicine due to Covid 19.  Advanced Directives 05/17/2019  Does Patient Have a Medical Advance Directive? No  Would patient like information on creating a medical advance directive? No - Patient declined    Tobacco Social History   Tobacco Use  Smoking Status Never Smoker  Smokeless Tobacco Never Used     Counseling given: Not Answered   Clinical Intake:     Pain : No/denies pain                 Past Medical History:  Diagnosis Date  . Allergy   . Depression   . Frequent headaches   . Osteopenia    Past Surgical History:  Procedure Laterality Date  . OTHER SURGICAL HISTORY  1998   jaw surgery, balanced it   Family History  Problem Relation Age of Onset  . Alzheimer's  disease Mother 93  . High Cholesterol Mother   . Cancer Father        Lung  . High Cholesterol Father   . Hypertension Brother   . Breast cancer Paternal Aunt    Social History   Socioeconomic History  . Marital status: Married    Spouse name: Not on file  . Number of children: Not on file  . Years of education: Not on file  . Highest education level: Not on file  Occupational History  . Not on file  Tobacco Use  . Smoking status: Never Smoker  . Smokeless tobacco: Never Used  Substance and Sexual Activity  . Alcohol use: Yes    Comment: seldom  . Drug use: No  . Sexual activity: Not on file  Other Topics Concern  . Not on file  Social History Narrative  . Not on file   Social Determinants of Health   Financial Resource Strain: Low Risk   . Difficulty of Paying Living Expenses: Not hard at all  Food Insecurity:   . Worried About Charity fundraiser in the Last Year: Not on file  . Ran Out of Food in the Last Year: Not on file  Transportation Needs: No Transportation Needs  . Lack of Transportation (Medical): No  .  Lack of Transportation (Non-Medical): No  Physical Activity:   . Days of Exercise per Week: Not on file  . Minutes of Exercise per Session: Not on file  Stress:   . Feeling of Stress : Not on file  Social Connections:   . Frequency of Communication with Friends and Family: Not on file  . Frequency of Social Gatherings with Friends and Family: Not on file  . Attends Religious Services: Not on file  . Active Member of Clubs or Organizations: Not on file  . Attends Archivist Meetings: Not on file  . Marital Status: Not on file    Outpatient Encounter Medications as of 05/17/2019  Medication Sig  . gabapentin (NEURONTIN) 100 MG capsule Take 100 mg by mouth daily.  . methocarbamol (ROBAXIN) 500 MG tablet TAKE 1 TABLET AS NEEDED FOR MUSCLE SPASMS AS DIRECTED  . omeprazole (PRILOSEC) 10 MG capsule Take 10 mg by mouth daily.  . rosuvastatin  (CRESTOR) 20 MG tablet Take 1 tablet (20 mg total) by mouth daily.  Marland Kitchen venlafaxine XR (EFFEXOR-XR) 150 MG 24 hr capsule TAKE 1 CAPSULE DAILY WITH BREAKFAST   No facility-administered encounter medications on file as of 05/17/2019.    Activities of Daily Living In your present state of health, do you have any difficulty performing the following activities: 05/17/2019  Hearing? N  Comment wearing hearing aids  Vision? N  Comment wears glasses to drive  Difficulty concentrating or making decisions? N  Walking or climbing stairs? N  Dressing or bathing? N  Doing errands, shopping? N  Preparing Food and eating ? N  Using the Toilet? N  In the past six months, have you accidently leaked urine? N  Do you have problems with loss of bowel control? N  Managing your Medications? N  Managing your Finances? N  Housekeeping or managing your Housekeeping? N  Some recent data might be hidden    Patient Care Team: [name], DO as PCP - General (Family Medicine)    Assessment:   This is a routine wellness examination for [name]. Physical assessment deferred to PCP.   Exercise Activities and Dietary recommendations Current Exercise Habits: Home exercise routine, Type of exercise: walking(at least 6000 steps per day.), Intensity: Mild, Exercise limited by: None identified Diet (meal preparation, eat out, water intake, caffeinated beverages, dairy products, fruits and vegetables): in general, a "healthy" diet  , well balanced   Goals    . Maintain healthy active lifestyle.       Fall Risk Fall Risk  05/17/2019 03/10/2019  Falls in the past year? 0 0  Comment - Emmi Telephone Survey: data to providers prior to load  Number falls in past yr: 0 -  Injury with Fall? 0 -  Follow up Education provided;Falls prevention discussed -     Depression Screen PHQ 2/9 Scores 05/17/2019  PHQ - 2 Score 0     Cognitive Function Ad8 score reviewed for issues:  Issues making  decisions:no  Less interest in hobbies / activities:no  Repeats questions, stories (family complaining):no  Trouble using ordinary gadgets (microwave, computer, phone):no  Forgets the month or year: no  Mismanaging finances: no  Remembering appts:no  Daily problems with thinking and/or memory:no Ad8 score is=0         Immunization History  Administered Date(s) Administered  . Fluad Quad(high Dose 65+) 01/27/2019  . Influenza, High Dose Seasonal PF 02/17/2018  . Pneumococcal Conjugate-13 03/20/2017, 11/12/2017  . Td 05/07/2017  . Zoster Recombinat (  Shingrix) 11/12/2017, 02/27/2018    Screening Tests Health Maintenance  Topic Date Due  . COLONOSCOPY  03/18/2001  . PNA vac Low Risk Adult (2 of 2 - PPSV23) 11/13/2018  . MAMMOGRAM  05/17/2019  . TETANUS/TDAP  05/08/2027  . INFLUENZA VACCINE  Completed  . DEXA SCAN  Completed  . Hepatitis C Screening  Completed      Plan:   See you next year!  Continue to eat heart healthy diet (full of fruits, vegetables, whole grains, lean protein, water--limit salt, fat, and sugar intake) and increase physical activity as tolerated.  Continue doing brain stimulating activities (puzzles, reading, adult coloring books, staying active) to keep memory sharp.   Bring a copy of your living will and/or healthcare power of attorney to your next office visit.  I have ordered your mammogram and bone density scan. Please schedule.   I have personally reviewed and noted the following in the patient's chart:   . Medical and social history . Use of alcohol, tobacco or illicit drugs  . Current medications and supplements . Functional ability and status . Nutritional status . Physical activity . Advanced directives . List of other physicians . Hospitalizations, surgeries, and ER visits in previous 12 months . Vitals . Screenings to include cognitive, depression, and falls . Referrals and appointments  In addition, I have reviewed and  discussed with patient certain preventive protocols, quality metrics, and best practice recommendations. A written personalized care plan for preventive services as well as general preventive health recommendations were provided to patient.     Shela Nevin, [place]  05/17/2019

## 2019-05-17 ENCOUNTER — Other Ambulatory Visit: Payer: Self-pay

## 2019-05-17 ENCOUNTER — Ambulatory Visit (INDEPENDENT_AMBULATORY_CARE_PROVIDER_SITE_OTHER): Payer: Medicare Other | Admitting: *Deleted

## 2019-05-17 ENCOUNTER — Encounter: Payer: Self-pay | Admitting: *Deleted

## 2019-05-17 ENCOUNTER — Ambulatory Visit: Payer: Medicare Other | Admitting: Family Medicine

## 2019-05-17 DIAGNOSIS — Z1231 Encounter for screening mammogram for malignant neoplasm of breast: Secondary | ICD-10-CM | POA: Diagnosis not present

## 2019-05-17 DIAGNOSIS — Z780 Asymptomatic menopausal state: Secondary | ICD-10-CM | POA: Diagnosis not present

## 2019-05-17 DIAGNOSIS — Z0000 Encounter for general adult medical examination without abnormal findings: Secondary | ICD-10-CM | POA: Diagnosis not present

## 2019-05-17 NOTE — Patient Instructions (Signed)
See you next year!  Continue to eat heart healthy diet (full of fruits, vegetables, whole grains, lean protein, water--limit salt, fat, and sugar intake) and increase physical activity as tolerated.  Continue doing brain stimulating activities (puzzles, reading, adult coloring books, staying active) to keep memory sharp.   Bring a copy of your living will and/or healthcare power of attorney to your next office visit.  I have ordered your mammogram and bone density scan. Please schedule.    Paige Patel , Thank you for taking time to come for your Medicare Wellness Visit. I appreciate your ongoing commitment to your health goals. Please review the following plan we discussed and let me know if I can assist you in the future.   These are the goals we discussed: Goals    . Maintain healthy active lifestyle.       This is a list of the screening recommended for you and due dates:  Health Maintenance  Topic Date Due  . Colon Cancer Screening  03/18/2001  . Pneumonia vaccines (2 of 2 - PPSV23) 11/13/2018  . Mammogram  05/17/2019  . Tetanus Vaccine  05/08/2027  . Flu Shot  Completed  . DEXA scan (bone density measurement)  Completed  .  Hepatitis C: One time screening is recommended by Center for Disease Control  (CDC) for  adults born from [birth year] through 1965.   Completed    Preventive Care 30 Years and Older, Female Preventive care refers to lifestyle choices and visits with your health care provider that can promote health and wellness. This includes:  A yearly physical exam. This is also called an annual well check.  Regular dental and eye exams.  Immunizations.  Screening for certain conditions.  Healthy lifestyle choices, such as diet and exercise. What can I expect for my preventive care visit? Physical exam Your health care provider will check:  Height and weight. These may be used to calculate body mass index (BMI), which is a measurement that tells if you are at a  healthy weight.  Heart rate and blood pressure.  Your skin for abnormal spots. Counseling Your health care provider may ask you questions about:  Alcohol, tobacco, and drug use.  Emotional well-being.  Home and relationship well-being.  Sexual activity.  Eating habits.  History of falls.  Memory and ability to understand (cognition).  Work and work Statistician.  Pregnancy and menstrual history. What immunizations do I need?  Influenza (flu) vaccine  This is recommended every year. Tetanus, diphtheria, and pertussis (Tdap) vaccine  You may need a Td booster every 10 years. Varicella (chickenpox) vaccine  You may need this vaccine if you have not already been vaccinated. Zoster (shingles) vaccine  You may need this after age 43. Pneumococcal conjugate (PCV13) vaccine  One dose is recommended after age 75. Pneumococcal polysaccharide (PPSV23) vaccine  One dose is recommended after age 34. Measles, mumps, and rubella (MMR) vaccine  You may need at least one dose of MMR if you were born in 1957 or later. You may also need a second dose. Meningococcal conjugate (MenACWY) vaccine  You may need this if you have certain conditions. Hepatitis A vaccine  You may need this if you have certain conditions or if you travel or work in places where you may be exposed to hepatitis A. Hepatitis B vaccine  You may need this if you have certain conditions or if you travel or work in places where you may be exposed to hepatitis B. Haemophilus influenzae  type b (Hib) vaccine  You may need this if you have certain conditions. You may receive vaccines as individual doses or as more than one vaccine together in one shot (combination vaccines). Talk with your health care provider about the risks and benefits of combination vaccines. What tests do I need? Blood tests  Lipid and cholesterol levels. These may be checked every 5 years, or more frequently depending on your overall  health.  Hepatitis C test.  Hepatitis B test. Screening  Lung cancer screening. You may have this screening every year starting at age 50 if you have a 30-pack-year history of smoking and currently smoke or have quit within the past 15 years.  Colorectal cancer screening. All adults should have this screening starting at age 59 and continuing until age 21. Your health care provider may recommend screening at age 37 if you are at increased risk. You will have tests every 1-10 years, depending on your results and the type of screening test.  Diabetes screening. This is done by checking your blood sugar (glucose) after you have not eaten for a while (fasting). You may have this done every 1-3 years.  Mammogram. This may be done every 1-2 years. Talk with your health care provider about how often you should have regular mammograms.  BRCA-related cancer screening. This may be done if you have a family history of breast, ovarian, tubal, or peritoneal cancers. Other tests  Sexually transmitted disease (STD) testing.  Bone density scan. This is done to screen for osteoporosis. You may have this done starting at age 90. Follow these instructions at home: Eating and drinking  Eat a diet that includes fresh fruits and vegetables, whole grains, lean protein, and low-fat dairy products. Limit your intake of foods with high amounts of sugar, saturated fats, and salt.  Take vitamin and mineral supplements as recommended by your health care provider.  Do not drink alcohol if your health care provider tells you not to drink.  If you drink alcohol: ? Limit how much you have to 0-1 drink a day. ? Be aware of how much alcohol is in your drink. In the [place] one drink equals one 12 oz bottle of beer (355 mL), one 5 oz glass of wine (148 mL), or one 1 oz glass of hard liquor (44 mL). Lifestyle  Take daily care of your teeth and gums.  Stay active. Exercise for at least 30 minutes on 5 or more days  each week.  Do not use any products that contain nicotine or tobacco, such as cigarettes, e-cigarettes, and chewing tobacco. If you need help quitting, ask your health care provider.  If you are sexually active, practice safe sex. Use a condom or other form of protection in order to prevent STIs (sexually transmitted infections).  Talk with your health care provider about taking a low-dose aspirin or statin. What's next?  Go to your health care provider once a year for a well check visit.  Ask your health care provider how often you should have your eyes and teeth checked.  Stay up to date on all vaccines. This information is not intended to replace advice given to you by your health care provider. Make sure you discuss any questions you have with your health care provider. Document Revised: 03/26/2018 Document Reviewed: 03/26/2018 Elsevier Patient Education  2020 [employer].

## 2019-06-01 ENCOUNTER — Other Ambulatory Visit: Payer: Self-pay

## 2019-06-02 ENCOUNTER — Ambulatory Visit (INDEPENDENT_AMBULATORY_CARE_PROVIDER_SITE_OTHER): Payer: Medicare Other | Admitting: Family Medicine

## 2019-06-02 ENCOUNTER — Other Ambulatory Visit: Payer: Self-pay

## 2019-06-02 ENCOUNTER — Encounter: Payer: Self-pay | Admitting: Family Medicine

## 2019-06-02 VITALS — BP 108/64 | HR 76 | Temp 95.3°F | Ht 68.0 in | Wt 169.1 lb

## 2019-06-02 DIAGNOSIS — F329 Major depressive disorder, single episode, unspecified: Secondary | ICD-10-CM | POA: Diagnosis not present

## 2019-06-02 DIAGNOSIS — M792 Neuralgia and neuritis, unspecified: Secondary | ICD-10-CM

## 2019-06-02 DIAGNOSIS — E785 Hyperlipidemia, unspecified: Secondary | ICD-10-CM | POA: Diagnosis not present

## 2019-06-02 DIAGNOSIS — J0100 Acute maxillary sinusitis, unspecified: Secondary | ICD-10-CM

## 2019-06-02 DIAGNOSIS — F32A Depression, unspecified: Secondary | ICD-10-CM

## 2019-06-02 LAB — COMPREHENSIVE METABOLIC PANEL
ALT: 21 U/L (ref 0–35)
AST: 21 U/L (ref 0–37)
Albumin: 4.6 g/dL (ref 3.5–5.2)
Alkaline Phosphatase: 101 U/L (ref 39–117)
BUN: 14 mg/dL (ref 6–23)
CO2: 28 mEq/L (ref 19–32)
Calcium: 10 mg/dL (ref 8.4–10.5)
Chloride: 103 mEq/L (ref 96–112)
Creatinine, Ser: 0.88 mg/dL (ref 0.40–1.20)
GFR: 63.86 mL/min (ref >60.00)
Glucose, Bld: 98 mg/dL (ref 70–99)
Potassium: 4.4 mEq/L (ref 3.5–5.1)
Sodium: 139 mEq/L (ref 135–145)
Total Bilirubin: 0.5 mg/dL (ref 0.2–1.2)
Total Protein: 6.7 g/dL (ref 6.0–8.3)

## 2019-06-02 LAB — LIPID PANEL
Cholesterol: 156 mg/dL (ref 0–200)
HDL: 64.9 mg/dL (ref >39.00)
LDL Cholesterol: 61 mg/dL (ref 0–99)
NonHDL: 90.93
Total CHOL/HDL Ratio: 2
Triglycerides: 152 mg/dL — ABNORMAL HIGH (ref 0.0–149.0)
VLDL: 30.4 mg/dL (ref 0.0–40.0)

## 2019-06-02 MED ORDER — GABAPENTIN 100 MG PO CAPS: 100.0000 mg | ORAL_CAPSULE | Freq: Two times a day (BID) | ORAL | 2 refills | Status: DC

## 2019-06-02 MED ORDER — METHYLPREDNISOLONE ACETATE 80 MG/ML IJ SUSP
80.0000 mg | Freq: Once | INTRAMUSCULAR | Status: AC
  Administered 2019-06-02: 08:00:00 80 mg via INTRAMUSCULAR

## 2019-06-02 NOTE — Patient Instructions (Addendum)
Give [place] 2-3 business days to get the results of your labs back.   Keep the diet clean and stay active.  Send me a message in 2 days if sinus issues are still bothersome.   Let us know if you need anything.

## 2019-06-02 NOTE — Progress Notes (Signed)
Chief Complaint  Patient presents with  . Follow-up    Subjective: Hyperlipidemia Patient presents for Hyperlipidemia follow up. Currently taking Crestor 20 mg/d and compliance with treatment thus far has been good. She denies myalgias. She is adhering to a healthy diet. Exercise: walking The patient is not known to have coexisting coronary artery disease.  Duration: 1 week  Associated symptoms: sinus congestion and sinus pain Denies: rhinorrhea, itchy watery eyes, ear pain, ear drainage, sore throat, wheezing, shortness of breath and fevers Treatment to date: ibuprofen, Tylenol Sick contacts: No  Depression well controlled on Effexor. Compliant, no AE's. No SI or HI.  ROS: Heart: Denies chest pain Lungs: Denies SOB  Past Medical History:  Diagnosis Date  . Allergy   . Depression   . Frequent headaches   . Osteopenia     Objective: BP 108/64 (BP Location: Left Arm, Patient Position: Sitting, Cuff Size: Normal)   Pulse 76   Temp (!) 95.3 F (35.2 C) (Temporal)   Ht 5\' 8"  (1.727 m)   Wt 169 lb 2 oz (76.7 kg)   SpO2 98%   BMI 25.72 kg/m  General: Awake, appears stated age HEENT: MMM, no sinus ttp, nares patent w/o dc, ears neg b/l Heart: RRR, no LE edema, no bruits Lungs: CTAB, no rales, wheezes or rhonchi. No accessory muscle use Psych: Age appropriate judgment and insight, normal affect and mood  Assessment and Plan: Dyslipidemia - Plan: Lipid panel, Comprehensive metabolic panel  Depression, unspecified depression type  Neuropathic pain - Plan: gabapentin (NEURONTIN) 100 MG capsule  1- Cont Crestor. Ck labs. 2- Cont Effexor.  3- Cont gabapentin.  4- Depomedrol IM today, likely 2/2 allergies. Send message in 2 days if no improvement.  F/u in 6 mo or prn. The patient voiced understanding and agreement to the plan.  [place], DO 06/02/19  8:07 AM

## 2019-08-03 ENCOUNTER — Ambulatory Visit
Admission: RE | Admit: 2019-08-03 | Discharge: 2019-08-03 | Disposition: A | Discharge status: Home / Self Care | Payer: Medicare Other | Source: Ambulatory Visit | Attending: Internal Medicine | Admitting: Internal Medicine

## 2019-08-03 ENCOUNTER — Other Ambulatory Visit: Payer: Self-pay

## 2019-08-03 DIAGNOSIS — Z780 Asymptomatic menopausal state: Secondary | ICD-10-CM | POA: Diagnosis not present

## 2019-08-03 DIAGNOSIS — M8589 Other specified disorders of bone density and structure, multiple sites: Secondary | ICD-10-CM | POA: Diagnosis not present

## 2019-08-03 DIAGNOSIS — Z1231 Encounter for screening mammogram for malignant neoplasm of breast: Secondary | ICD-10-CM | POA: Diagnosis not present

## 2019-10-16 ENCOUNTER — Other Ambulatory Visit: Payer: Self-pay | Admitting: Family Medicine

## 2019-12-01 ENCOUNTER — Ambulatory Visit: Payer: Medicare Other | Admitting: Family Medicine

## 2019-12-06 ENCOUNTER — Encounter: Payer: Self-pay | Admitting: Family Medicine

## 2019-12-06 ENCOUNTER — Other Ambulatory Visit: Payer: Self-pay

## 2019-12-06 ENCOUNTER — Ambulatory Visit (INDEPENDENT_AMBULATORY_CARE_PROVIDER_SITE_OTHER): Payer: Medicare Other | Admitting: Family Medicine

## 2019-12-06 VITALS — BP 108/76 | HR 88 | Temp 97.9°F | Ht 68.0 in | Wt 171.0 lb

## 2019-12-06 DIAGNOSIS — Z23 Encounter for immunization: Secondary | ICD-10-CM

## 2019-12-06 DIAGNOSIS — E785 Hyperlipidemia, unspecified: Secondary | ICD-10-CM

## 2019-12-06 DIAGNOSIS — M792 Neuralgia and neuritis, unspecified: Secondary | ICD-10-CM

## 2019-12-06 DIAGNOSIS — F325 Major depressive disorder, single episode, in full remission: Secondary | ICD-10-CM

## 2019-12-06 MED ORDER — FLUTICASONE PROPIONATE 50 MCG/ACT NA SUSP: 2.0000 | Freq: Every day | NASAL | 2 refills | Status: DC

## 2019-12-06 MED ORDER — OLOPATADINE HCL 0.2 % OP SOLN: OPHTHALMIC | 0 refills | Status: DC

## 2019-12-06 NOTE — Patient Instructions (Addendum)
Keep the diet clean and stay active.  Please consider counseling. Contact [phone] to schedule an appointment or inquire about cost/insurance coverage.  I recommend getting the flu shot in mid October. This suggestion would change if the CDC comes out with a different recommendation.   Let us know if you need anything.

## 2019-12-06 NOTE — Progress Notes (Signed)
Chief Complaint  Patient presents with  . Follow-up    Subjective: Hyperlipidemia Patient presents for Hyperlipidemia follow up. Currently taking Crestor 20 mg/d and compliance with treatment thus far has been good. She denies myalgias. She is adhering to a healthy diet. Exercise: walking The patient is not known to have coexisting coronary artery disease.  Depression Patient has a history of depression for which she takes venlafaxine Exar 150 mg daily.  Reports compliance, no adverse effects.  Things have been stressful with the health of her husband who has cancer.  She believes his health is declining steadily.  She is not following with a counselor or psychologist.  No homicidal or suicidal ideation.  The patient has a history of neuropathic pain.  She takes gabapentin 100 mg twice daily which adequately controls her issues.  No adverse effects and she does report compliance.  Past Medical History:  Diagnosis Date  . Allergy   . Depression   . Frequent headaches   . Osteopenia     Objective: BP 108/76 (BP Location: Left Arm, Patient Position: Sitting, Cuff Size: Normal)   Pulse 88   Temp 97.9 F (36.6 C) (Oral)   Ht 5\' 8"  (1.727 m)   Wt 171 lb (77.6 kg)   SpO2 98%   BMI 26.00 kg/m  General: Awake, appears stated age Heart: RRR, no LE edema, no bruits Lungs: CTAB, no rales, wheezes or rhonchi. No accessory muscle use Psych: Age appropriate judgment and insight, normal affect and mood  Assessment and Plan: Dyslipidemia  Major depressive disorder with single episode, in full remission ([place])  Neuropathic pain  Need for vaccination against Streptococcus pneumoniae - Plan: Pneumococcal polysaccharide vaccine 23-valent greater than or equal to 2yo subcutaneous/IM  1.  Continue Crestor.  Counseled on diet and exercise. 2. Cont Effexor. Counseling info given.  3. Cont gabapentin.  F/u in 6 mo. The patient voiced understanding and agreement to the plan.  [place], DO 12/06/19  10:42 AM

## 2020-01-24 ENCOUNTER — Encounter: Payer: Self-pay | Admitting: Internal Medicine

## 2020-02-04 DIAGNOSIS — Z23 Encounter for immunization: Secondary | ICD-10-CM | POA: Diagnosis not present

## 2020-02-14 ENCOUNTER — Other Ambulatory Visit: Payer: Self-pay | Admitting: Family Medicine

## 2020-02-14 DIAGNOSIS — M792 Neuralgia and neuritis, unspecified: Secondary | ICD-10-CM

## 2020-03-27 ENCOUNTER — Encounter: Payer: Medicare Other | Admitting: Internal Medicine

## 2020-03-28 ENCOUNTER — Ambulatory Visit (AMBULATORY_SURGERY_CENTER): Payer: Self-pay

## 2020-03-28 ENCOUNTER — Other Ambulatory Visit: Payer: Self-pay

## 2020-03-28 VITALS — Ht 68.0 in | Wt 170.0 lb

## 2020-03-28 DIAGNOSIS — Z1211 Encounter for screening for malignant neoplasm of colon: Secondary | ICD-10-CM

## 2020-03-28 MED ORDER — PLENVU 140 G PO SOLR: 1.0000 | ORAL | 0 refills | Status: DC

## 2020-03-28 NOTE — Progress Notes (Signed)
No allergies to soy or egg Pt is not on blood thinners or diet pills Denies issues with sedation/intubation Denies atrial flutter/fib Denies constipation   Emmi instructions given to pt  Pt is aware of Covid safety and care partner requirements.   Last colonoscopy was >10 yrs ago.  States she had some type of colitis.

## 2020-04-12 ENCOUNTER — Other Ambulatory Visit: Payer: Self-pay | Admitting: Internal Medicine

## 2020-04-12 ENCOUNTER — Other Ambulatory Visit: Payer: Self-pay

## 2020-04-12 ENCOUNTER — Ambulatory Visit (AMBULATORY_SURGERY_CENTER): Payer: Medicare Other | Admitting: Internal Medicine

## 2020-04-12 ENCOUNTER — Encounter: Payer: Self-pay | Admitting: Internal Medicine

## 2020-04-12 VITALS — BP 130/63 | HR 62 | Temp 96.9°F | Resp 18 | Ht 68.0 in | Wt 170.0 lb

## 2020-04-12 DIAGNOSIS — K635 Polyp of colon: Secondary | ICD-10-CM | POA: Diagnosis not present

## 2020-04-12 DIAGNOSIS — K52831 Collagenous colitis: Secondary | ICD-10-CM | POA: Diagnosis not present

## 2020-04-12 DIAGNOSIS — K219 Gastro-esophageal reflux disease without esophagitis: Secondary | ICD-10-CM | POA: Diagnosis not present

## 2020-04-12 DIAGNOSIS — Z1211 Encounter for screening for malignant neoplasm of colon: Secondary | ICD-10-CM

## 2020-04-12 DIAGNOSIS — D123 Benign neoplasm of transverse colon: Secondary | ICD-10-CM

## 2020-04-12 DIAGNOSIS — Z86010 Personal history of colonic polyps: Secondary | ICD-10-CM | POA: Diagnosis not present

## 2020-04-12 MED ORDER — SODIUM CHLORIDE 0.9 % IV SOLN: 500.0000 mL | Freq: Once | INTRAVENOUS | Status: DC

## 2020-04-12 NOTE — Progress Notes (Signed)
Called to room to assist during endoscopic procedure.  Patient ID and intended procedure confirmed with present staff. Received instructions for my participation in the procedure from the performing physician.  

## 2020-04-12 NOTE — Progress Notes (Signed)
No problems noted in the recovery room. maw 

## 2020-04-12 NOTE — Progress Notes (Signed)
Pt's states no medical or surgical changes since previsit or office visit.  ° °VS AG °

## 2020-04-12 NOTE — Op Note (Signed)
[place] Patient Name: Paige Patel Procedure Date: 04/12/2020 4:33 PM MRN: [id number] Endoscopist: Jerene Bears , MD Age: 69 Referring MD:  Date of Birth: 04-16-1950 Gender: Female Account #: [id number] Procedure:                Colonoscopy Indications:              Screening for colorectal malignant neoplasm,                            personal history of microscopic colitis                            (collagenous colitis) diagnosed by colonoscopy in                            [place] (currently patient reports she "lives                            with" this condition Medicines:                Monitored Anesthesia Care Procedure:                Pre-Anesthesia Assessment:                           - Prior to the procedure, a History and Physical                            was performed, and patient medications and                            allergies were reviewed. The patient's tolerance of                            previous anesthesia was also reviewed. The risks                            and benefits of the procedure and the sedation                            options and risks were discussed with the patient.                            All questions were answered, and informed consent                            was obtained. Prior Anticoagulants: The patient has                            taken no previous anticoagulant or antiplatelet                            agents. ASA Grade Assessment: II - A patient with  mild systemic disease. After reviewing the risks                            and benefits, the patient was deemed in                            satisfactory condition to undergo the procedure.                           After obtaining informed consent, the colonoscope                            was passed under direct vision. Throughout the                            procedure, the patient's blood pressure, pulse, and                             oxygen saturations were monitored continuously. The                            Olympus PFC-H190DL [id number]) Colonoscope was                            introduced through the anus and advanced to the                            cecum, identified by appendiceal orifice and                            ileocecal valve. The colonoscopy was performed                            without difficulty. The patient tolerated the                            procedure well. The quality of the bowel                            preparation was good. The ileocecal valve,                            appendiceal orifice, and rectum were photographed. Scope In: 4:41:41 PM Scope Out: 5:06:53 PM Scope Withdrawal Time: 0 hours 16 minutes 30 seconds  Total Procedure Duration: 0 hours 25 minutes 12 seconds  Findings:                 The digital rectal exam was normal.                           Two sessile polyps were found in the transverse                            colon. The polyps were 3 to 6 mm in size. These  polyps were removed with a cold snare. Resection                            and retrieval were complete.                           A few small-mouthed diverticula were found in the                            sigmoid colon.                           The exam was otherwise normal throughout the                            examined colon.                           Biopsies for histology were taken with a cold                            forceps from the right colon and left colon for                            evaluation of microscopic colitis given history of                            collagenous colitis.                           Anal papillae were hypertrophied on retroflexed                            views in the rectum. Complications:            No immediate complications. Estimated Blood Loss:     Estimated blood loss was minimal. Impression:               -  Two 3 to 6 mm polyps in the transverse colon,                            removed with a cold snare. Resected and retrieved.                           - Diverticulosis in the sigmoid colon.                           - Biopsies were taken with a cold forceps from the                            right colon and left colon for evaluation of                            microscopic colitis. Recommendation:           - Patient has a contact number available for  emergencies. The signs and symptoms of potential                            delayed complications were discussed with the                            patient. Return to normal activities tomorrow.                            Written discharge instructions were provided to the                            patient.                           - Resume previous diet.                           - Continue present medications.                           - Await pathology results.                           - Repeat colonoscopy is recommended. The                            colonoscopy date will be determined after pathology                            results from today's exam become available for                            review. Jerene Bears, MD 04/12/2020 5:15:22 PM This report has been signed electronically.

## 2020-04-12 NOTE — Patient Instructions (Addendum)
Handouts were given to you on polyps and diverticulosis. You may resume your current medications today. Await biopsy results.  May take 2-3 weeks to receive your pathology results. Please call if any questions or concerns.      YOU HAD AN ENDOSCOPIC PROCEDURE TODAY AT THE [facility]:   Refer to the procedure report that was given to you for any specific questions about what was found during the examination.  If the procedure report does not answer your questions, please call your gastroenterologist to clarify.  If you requested that your care partner not be given the details of your procedure findings, then the procedure report has been included in a sealed envelope for you to review at your convenience later.  YOU SHOULD EXPECT: Some feelings of bloating in the abdomen. Passage of more gas than usual.  Walking can help get rid of the air that was put into your GI tract during the procedure and reduce the bloating. If you had a lower endoscopy (such as a colonoscopy or flexible sigmoidoscopy) you may notice spotting of blood in your stool or on the toilet paper. If you underwent a bowel prep for your procedure, you may not have a normal bowel movement for a few days.  Please Note:  You might notice some irritation and congestion in your nose or some drainage.  This is from the oxygen used during your procedure.  There is no need for concern and it should clear up in a day or so.  SYMPTOMS TO REPORT IMMEDIATELY:   Following lower endoscopy (colonoscopy or flexible sigmoidoscopy):  Excessive amounts of blood in the stool  Significant tenderness or worsening of abdominal pains  Swelling of the abdomen that is new, acute  Fever of 100F or higher    For urgent or emergent issues, a gastroenterologist can be reached at any hour by calling [phone]. Do not use MyChart messaging for urgent concerns.    DIET:  We do recommend a small meal at first, but then you may proceed  to your regular diet.  Drink plenty of fluids but you should avoid alcoholic beverages for 24 hours.  ACTIVITY:  You should plan to take it easy for the rest of today and you should NOT DRIVE or use heavy machinery until tomorrow (because of the sedation medicines used during the test).    FOLLOW UP: Our staff will call the number listed on your records 48-72 hours following your procedure to check on you and address any questions or concerns that you may have regarding the information given to you following your procedure. If we do not reach you, we will leave a message.  We will attempt to reach you two times.  During this call, we will ask if you have developed any symptoms of COVID 19. If you develop any symptoms (ie: fever, flu-like symptoms, shortness of breath, cough etc.) before then, please call [phone].  If you test positive for Covid 19 in the 2 weeks post procedure, please call and report this information to [place].    If any biopsies were taken you will be contacted by phone or by letter within the next 1-3 weeks.  Please call us at [phone] if you have not heard about the biopsies in 3 weeks.    SIGNATURES/CONFIDENTIALITY: You and/or your care partner have signed paperwork which will be entered into your electronic medical record.  These signatures attest to the fact that that the information above on your After  Visit Summary has been reviewed and is understood.  Full responsibility of the confidentiality of this discharge information lies with you and/or your care-partner.

## 2020-04-12 NOTE — Progress Notes (Signed)
Report given to PACU, vss 

## 2020-04-13 ENCOUNTER — Other Ambulatory Visit: Payer: Self-pay | Admitting: Family Medicine

## 2020-04-13 DIAGNOSIS — F339 Major depressive disorder, recurrent, unspecified: Secondary | ICD-10-CM

## 2020-04-17 ENCOUNTER — Telehealth: Payer: Self-pay

## 2020-04-17 NOTE — Telephone Encounter (Signed)
  Follow up Call-  Call back number 04/12/2020  Post procedure Call Back phone  # [phone]  Permission to leave phone message Yes  Some recent data might be hidden     Patient questions:  Do you have a fever, pain , or abdominal swelling? No. Pain Score  0 *  Have you tolerated food without any problems? Yes.    Have you been able to return to your normal activities? Yes.    Do you have any questions about your discharge instructions: Diet   No. Medications  No. Follow up visit  No.  Do you have questions or concerns about your Care? No.  Actions: * If pain score is 4 or above: No action needed, pain <4.  1. Have you developed a fever since your procedure? no  2.   Have you had an respiratory symptoms (SOB or cough) since your procedure? no  3.   Have you tested positive for COVID 19 since your procedure no  4.   Have you had any family members/close contacts diagnosed with the COVID 19 since your procedure?  no   If yes to any of these questions please route to Laverna Peace, RN and Karlton Lemon, RN

## 2020-04-20 ENCOUNTER — Encounter: Payer: Self-pay | Admitting: Internal Medicine

## 2020-04-24 ENCOUNTER — Ambulatory Visit (INDEPENDENT_AMBULATORY_CARE_PROVIDER_SITE_OTHER): Payer: Medicare Other | Admitting: Family Medicine

## 2020-04-24 ENCOUNTER — Encounter: Payer: Self-pay | Admitting: Family Medicine

## 2020-04-24 ENCOUNTER — Other Ambulatory Visit: Payer: Self-pay

## 2020-04-24 VITALS — BP 122/78 | HR 83 | Temp 98.3°F | Ht 68.0 in | Wt 168.5 lb

## 2020-04-24 DIAGNOSIS — F325 Major depressive disorder, single episode, in full remission: Secondary | ICD-10-CM | POA: Diagnosis not present

## 2020-04-24 DIAGNOSIS — M792 Neuralgia and neuritis, unspecified: Secondary | ICD-10-CM | POA: Diagnosis not present

## 2020-04-24 DIAGNOSIS — E785 Hyperlipidemia, unspecified: Secondary | ICD-10-CM

## 2020-04-24 LAB — COMPREHENSIVE METABOLIC PANEL
ALT: 14 U/L (ref 0–35)
AST: 15 U/L (ref 0–37)
Albumin: 4.8 g/dL (ref 3.5–5.2)
Alkaline Phosphatase: 111 U/L (ref 39–117)
BUN: 16 mg/dL (ref 6–23)
CO2: 25 mEq/L (ref 19–32)
Calcium: 9.8 mg/dL (ref 8.4–10.5)
Chloride: 103 mEq/L (ref 96–112)
Creatinine, Ser: 0.82 mg/dL (ref 0.40–1.20)
GFR: 73.15 mL/min (ref >60.00)
Glucose, Bld: 98 mg/dL (ref 70–99)
Potassium: 4.4 mEq/L (ref 3.5–5.1)
Sodium: 136 mEq/L (ref 135–145)
Total Bilirubin: 0.5 mg/dL (ref 0.2–1.2)
Total Protein: 6.8 g/dL (ref 6.0–8.3)

## 2020-04-24 LAB — LIPID PANEL
Cholesterol: 168 mg/dL (ref 0–200)
HDL: 64.9 mg/dL (ref >39.00)
LDL Cholesterol: 76 mg/dL (ref 0–99)
NonHDL: 103.14
Total CHOL/HDL Ratio: 3
Triglycerides: 134 mg/dL (ref 0.0–149.0)
VLDL: 26.8 mg/dL (ref 0.0–40.0)

## 2020-04-24 MED ORDER — GABAPENTIN 100 MG PO CAPS: ORAL_CAPSULE | ORAL | 2 refills | Status: DC

## 2020-04-24 NOTE — Progress Notes (Signed)
Chief Complaint  Patient presents with  . Follow-up    Subjective Paige Patel presents for f/u anxiety/depression.  Pt is currently being treated with Effexor XR 150 mg/d.  Reports doing fine since treatment. No thoughts of harming self or others. No self-medication with alcohol, prescription drugs or illicit drugs. Pt is not following with a counselor/psychologist.  Hyperlipidemia Patient presents for dyslipidemia follow up. Currently being treated with Crestor 20 mg/d and compliance with treatment thus far has been good. She denies myalgias. She is adhering to a healthy diet. Exercise: walking, active with volunteering The patient is not known to have coexisting coronary artery disease. No Cp or SOB.   Neuropathic pain +hx of this, taking gabapentin 100 mg in AM and 200 mg in PM. This is controlling her s/s's. No AE's.    Past Medical History:  Diagnosis Date  . Allergy    seasonal  . Arthritis   . Depression   . Frequent headaches   . Neuromuscular disorder (HCC)    neuropathy  . Osteopenia   . Osteoporosis    Allergies as of 04/24/2020   No Known Allergies     Medication List       Accurate as of April 24, 2020 10:30 AM. If you have any questions, ask your nurse or doctor.        fluticasone 50 MCG/ACT nasal spray Commonly known as: FLONASE Place 2 sprays into both nostrils daily.   gabapentin 100 MG capsule Commonly known as: NEURONTIN Take 1 capsule in the morning and 2 capsules in the evening. What changed:   how much to take  how to take this  when to take this  additional instructions Changed by: [name], DO   methocarbamol 500 MG tablet Commonly known as: ROBAXIN TAKE 1 TABLET AS NEEDED FOR MUSCLE SPASMS AS DIRECTED   Olopatadine HCl 0.2 % Soln Apply 1 drop per eye twice daily.   omeprazole 10 MG capsule Commonly known as: PRILOSEC Take 10 mg by mouth daily.   rosuvastatin 20 MG tablet Commonly known as:  CRESTOR TAKE 1 TABLET DAILY   venlafaxine XR 150 MG 24 hr capsule Commonly known as: EFFEXOR-XR Take 1 capsule (150 mg total) by mouth daily with breakfast.       Exam BP 122/78 (BP Location: Left Arm, Patient Position: Sitting, Cuff Size: Normal)   Pulse 83   Temp 98.3 F (36.8 C) (Oral)   Ht 5\' 8"  (1.727 m)   Wt 168 lb 8 oz (76.4 kg)   SpO2 97%   BMI 25.62 kg/m  General:  well developed, well nourished, in no apparent distress Lungs:  No respiratory distress Psych: well oriented with normal range of affect and age-appropriate judgement/insight, alert and oriented x4.  Assessment and Plan  Dyslipidemia - Plan: Comprehensive metabolic panel, Lipid panel  Major depressive disorder with single episode, in full remission ([place])  Neuropathic pain - Plan: gabapentin (NEURONTIN) 100 MG capsule  1. Counseled on diet/exercise. Cont Crestor 20 mg/d.  2. Cont Effexor XR 150 mg/d.  3. Cont gabapentin 100 mg in AM, 200 mg in PM.  F/u in 6 mo. The patient voiced understanding and agreement to the plan.  [place], DO 04/24/20 10:30 AM

## 2020-04-24 NOTE — Patient Instructions (Addendum)
Have fun in [place].  Keep the diet clean and stay active.  Give [place] 2-3 business days to get the results of your labs back.   Add back an allergy pill.   Let us know if you need anything.

## 2020-06-07 ENCOUNTER — Ambulatory Visit: Payer: Medicare Other | Admitting: Family Medicine

## 2020-07-12 ENCOUNTER — Other Ambulatory Visit: Payer: Self-pay | Admitting: Family Medicine

## 2020-07-12 DIAGNOSIS — F339 Major depressive disorder, recurrent, unspecified: Secondary | ICD-10-CM

## 2020-08-08 DIAGNOSIS — Z23 Encounter for immunization: Secondary | ICD-10-CM | POA: Diagnosis not present

## 2020-09-19 ENCOUNTER — Ambulatory Visit (INDEPENDENT_AMBULATORY_CARE_PROVIDER_SITE_OTHER): Payer: Medicare Other

## 2020-09-19 VITALS — Ht 68.0 in | Wt 168.0 lb

## 2020-09-19 DIAGNOSIS — Z0000 Encounter for general adult medical examination without abnormal findings: Secondary | ICD-10-CM | POA: Diagnosis not present

## 2020-09-19 NOTE — Progress Notes (Signed)
Subjective:   Paige Patel is a 70 y.o. female who presents for Medicare Annual (Subsequent) preventive examination.  I connected with [place] today by telephone and verified that I am speaking with the correct person using two identifiers. Location patient: home Location provider: work Persons participating in the virtual visit: patient, [name].    I discussed the limitations, risks, security and privacy concerns of performing an evaluation and management service by telephone and the availability of in person appointments. I also discussed with the patient that there may be a patient responsible charge related to this service. The patient expressed understanding and verbally consented to this telephonic visit.    Interactive audio and video telecommunications were attempted between this provider and patient, however failed, due to patient having technical difficulties OR patient did not have access to video capability.  We continued and completed visit with audio only.  Some vital signs may be absent or patient reported.   Time Spent with patient on telephone encounter: 20 minutes   Review of Systems     Cardiac Risk Factors include: advanced age (>28men, >90 women);dyslipidemia     Objective:    Today's Vitals   09/19/20 1258  Weight: 168 lb (76.2 kg)  Height: 5\' 8"  (1.727 m)   Body mass index is 25.54 kg/m.  Advanced Directives 09/19/2020 05/17/2019  Does Patient Have a Medical Advance Directive? Yes No  Type of Paramedic of [place];Living will -  Copy of [place] in Chart? No - copy requested -  Would patient like information on creating a medical advance directive? - No - Patient declined    Current Medications (verified) Outpatient Encounter Medications as of 09/19/2020  Medication Sig  . fluticasone (FLONASE) 50 MCG/ACT nasal spray Place 2 sprays into both nostrils daily.  Marland Kitchen gabapentin (NEURONTIN) 100 MG capsule Take 1 capsule  in the morning and 2 capsules in the evening.  . methocarbamol (ROBAXIN) 500 MG tablet TAKE 1 TABLET AS NEEDED FOR MUSCLE SPASMS AS DIRECTED  . Olopatadine HCl 0.2 % SOLN Apply 1 drop per eye twice daily.  Marland Kitchen omeprazole (PRILOSEC) 10 MG capsule Take 10 mg by mouth daily.  . rosuvastatin (CRESTOR) 20 MG tablet TAKE 1 TABLET DAILY  . venlafaxine XR (EFFEXOR-XR) 150 MG 24 hr capsule TAKE 1 CAPSULE DAILY WITH BREAKFAST   No facility-administered encounter medications on file as of 09/19/2020.    Allergies (verified) Patient has no known allergies.   History: Past Medical History:  Diagnosis Date  . Allergy    seasonal  . Arthritis   . Depression   . Frequent headaches   . Neuromuscular disorder (HCC)    neuropathy  . Osteopenia   . Osteoporosis    Past Surgical History:  Procedure Laterality Date  . COLONOSCOPY  > 10 yrs  . MANDIBLE SURGERY    . OTHER SURGICAL HISTORY  1998   jaw surgery, balanced it   Family History  Problem Relation Age of Onset  . Alzheimer's disease Mother 33  . High Cholesterol Mother   . Cancer Father        Lung  . High Cholesterol Father   . Hypertension Brother   . Breast cancer Paternal Aunt   . Colon cancer Neg Hx   . Colon polyps Neg Hx   . Esophageal cancer Neg Hx   . Rectal cancer Neg Hx   . Stomach cancer Neg Hx    Social History   Socioeconomic History  .  Marital status: Married    Spouse name: Not on file  . Number of children: Not on file  . Years of education: Not on file  . Highest education level: Not on file  Occupational History  . Not on file  Tobacco Use  . Smoking status: Never Smoker  . Smokeless tobacco: Never Used  Vaping Use  . Vaping Use: Never used  Substance and Sexual Activity  . Alcohol use: Yes    Comment: seldom  . Drug use: No  . Sexual activity: Not on file  Other Topics Concern  . Not on file  Social History Narrative  . Not on file   Social Determinants of Health   Financial Resource Strain:  Low Risk   . Difficulty of Paying Living Expenses: Not hard at all  Food Insecurity: No Food Insecurity  . Worried About Charity fundraiser in the Last Year: Never true  . Ran Out of Food in the Last Year: Never true  Transportation Needs: No Transportation Needs  . Lack of Transportation (Medical): No  . Lack of Transportation (Non-Medical): No  Physical Activity: Sufficiently Active  . Days of Exercise per Week: 7 days  . Minutes of Exercise per Session: 30 min  Stress: No Stress Concern Present  . Feeling of Stress : Not at all  Social Connections: Socially Integrated  . Frequency of Communication with Friends and Family: More than three times a week  . Frequency of Social Gatherings with Friends and Family: More than three times a week  . Attends Religious Services: 1 to 4 times per year  . Active Member of Clubs or Organizations: Yes  . Attends Archivist Meetings: 1 to 4 times per year  . Marital Status: Married    Tobacco Counseling Counseling given: Not Answered   Clinical Intake:  Pre-visit preparation completed: Yes  Pain : No/denies pain     Nutritional Status: BMI 25 -29 Overweight Nutritional Risks: None Diabetes: No  How often do you need to have someone help you when you read instructions, pamphlets, or other written materials from your doctor or pharmacy?: 1 - Never  Diabetic?No  Interpreter Needed?: No  Information entered by :: Caroleen Hamman LPN   Activities of Daily Living In your present state of health, do you have any difficulty performing the following activities: 09/19/2020  Hearing? Y  Comment hearing aids  Vision? N  Difficulty concentrating or making decisions? N  Walking or climbing stairs? N  Dressing or bathing? N  Preparing Food and eating ? N  Using the Toilet? N  In the past six months, have you accidently leaked urine? N  Do you have problems with loss of bowel control? N  Managing your Medications? N  Managing  your Finances? N  Housekeeping or managing your Housekeeping? N  Some recent data might be hidden    Patient Care Team: [name], DO as PCP - General (Family Medicine)  Indicate any recent Medical Services you may have received from other than [facility] providers in the past year (date may be approximate).     Assessment:   This is a routine wellness examination for Arthur.  Hearing/Vision screen  Hearing Screening   125Hz  250Hz  500Hz  1000Hz  2000Hz  3000Hz  4000Hz  6000Hz  8000Hz   Right ear:           Left ear:           Comments: Bilateral hearing aids  Vision Screening Comments: Wears glasses Last eye-several  years ago  Dietary issues and exercise activities discussed: Current Exercise Habits: Home exercise routine, Type of exercise: walking, Time (Minutes): 30, Frequency (Times/Week): 7, Weekly Exercise (Minutes/Week): 210, Intensity: Mild, Exercise limited by: None identified  Goals Addressed            This Visit's Progress   . Maintain healthy active lifestyle.   On track     Depression Screen PHQ 2/9 Scores 09/19/2020 05/17/2019  PHQ - 2 Score 0 0    Fall Risk Fall Risk  09/19/2020 05/17/2019 03/10/2019  Falls in the past year? 0 0 0  Comment - - Emmi Telephone Survey: data to providers prior to load  Number falls in past yr: 0 0 -  Injury with Fall? 0 0 -  Follow up Falls prevention discussed Education provided;Falls prevention discussed -    FALL RISK PREVENTION PERTAINING TO THE HOME:  Any stairs in or around the home? Yes  If so, are there any without handrails? No  Home free of loose throw rugs in walkways, pet beds, electrical cords, etc? Yes  Adequate lighting in your home to reduce risk of falls? Yes   ASSISTIVE DEVICES UTILIZED TO PREVENT FALLS:  Life alert? No  Use of a cane, walker or w/c? No  Grab bars in the bathroom? Yes  Shower chair or bench in shower? No  Elevated toilet seat or a handicapped toilet? No   TIMED UP AND GO:  Was the  test performed? No . Phone visit  Cognitive Function:Normal cognitive status assessed by  this Nurse Health Advisor. No abnormalities found.          Immunizations Immunization History  Administered Date(s) Administered  . Fluad Quad(high Dose 65+) 01/27/2019  . Influenza, High Dose Seasonal PF 02/17/2018, 02/04/2020  . PFIZER(Purple Top)SARS-COV-2 Vaccination 05/21/2019, 06/11/2019, 02/04/2020  . Pneumococcal Conjugate-13 03/20/2017, 11/12/2017  . Pneumococcal Polysaccharide-23 12/06/2019  . Td 05/07/2017  . Zoster Recombinat (Shingrix) 11/12/2017, 02/27/2018    TDAP status: Up to date  Flu Vaccine status: Up to date  Pneumococcal vaccine status: Up to date  Covid-19 vaccine status: Completed vaccines  Qualifies for Shingles Vaccine? No   Zostavax completed No   Shingrix Completed?: Yes  Screening Tests Health Maintenance  Topic Date Due  . INFLUENZA VACCINE  11/13/2020  . MAMMOGRAM  08/02/2021  . COLONOSCOPY (Pts 45-97yrs Insurance coverage will need to be confirmed)  04/12/2025  . TETANUS/TDAP  05/08/2027  . DEXA SCAN  Completed  . COVID-19 Vaccine  Completed  . Hepatitis C Screening  Completed  . PNA vac Low Risk Adult  Completed  . Zoster Vaccines- Shingrix  Completed  . Pneumococcal Vaccine 68-69 Years old  Aged Out  . HPV VACCINES  Aged Out    Health Maintenance  There are no preventive care reminders to display for this patient.  Colorectal cancer screening: Type of screening: Colonoscopy. Completed 04/12/2020. Repeat every 5 years  Mammogram status: Due-Declined today  Bone Density status: Completed 08/03/2019. Results reflect: Bone density results: OSTEOPENIA. Repeat every 2 years.  Lung Cancer Screening: (Low Dose CT Chest recommended if Age 31-80 years, 30 pack-year currently smoking OR have quit w/in 15years.) does not qualify.     Additional Screening:  Hepatitis C Screening: Completed 05/07/2017  Vision Screening: Recommended annual  ophthalmology exams for early detection of glaucoma and other disorders of the eye. Is the patient up to date with their annual eye exam?  No  Who is the provider or what is the  name of the office in which the patient attends annual eye exams? Unsure of name Patient advised to make an appt  Dental Screening: Recommended annual dental exams for proper oral hygiene  Community Resource Referral / Chronic Care Management: CRR required this visit?  No   CCM required this visit?  No      Plan:     I have personally reviewed and noted the following in the patient's chart:   . Medical and social history . Use of alcohol, tobacco or illicit drugs  . Current medications and supplements including opioid prescriptions.  . Functional ability and status . Nutritional status . Physical activity . Advanced directives . List of other physicians . Hospitalizations, surgeries, and ER visits in previous 12 months . Vitals . Screenings to include cognitive, depression, and falls . Referrals and appointments  In addition, I have reviewed and discussed with patient certain preventive protocols, quality metrics, and best practice recommendations. A written personalized care plan for preventive services as well as general preventive health recommendations were provided to patient.   Due to this being a telephonic visit, the after visit summary with patients personalized plan was offered to patient via mail or my-chart.  Patient would like to access on my-chart.    Marta Antu, LPN   07/21/6280  Nurse Health Advisor  Nurse Notes: None

## 2020-09-19 NOTE — Patient Instructions (Signed)
Paige Patel , Thank you for taking time to complete your Medicare Wellness Visit. I appreciate your ongoing commitment to your health goals. Please review the following plan we discussed and let me know if I can assist you in the future.   Screening recommendations/referrals: Colonoscopy: Completed 04/12/2020-Due 04/12/2025 Mammogram: Due-Declined today. Bone Density: Completed 07/2019-Due 07/2021 Recommended yearly ophthalmology/optometry visit for glaucoma screening and checkup Recommended yearly dental visit for hygiene and checkup  Vaccinations: Influenza vaccine: Up to date Pneumococcal vaccine: Up to date Tdap vaccine: Up to date-Due-04/2027 Shingles vaccine: Completed vaccines   Covid-19:Up to date  Advanced directives: Please bring a copy for your chart  Conditions/risks identified: See problem list  Next appointment: Follow up in one year for your annual wellness visit    Preventive Care 54 Years and Older, Female Preventive care refers to lifestyle choices and visits with your health care provider that can promote health and wellness. What does preventive care include?  A yearly physical exam. This is also called an annual well check.  Dental exams once or twice a year.  Routine eye exams. Ask your health care provider how often you should have your eyes checked.  Personal lifestyle choices, including:  Daily care of your teeth and gums.  Regular physical activity.  Eating a healthy diet.  Avoiding tobacco and drug use.  Limiting alcohol use.  Practicing safe sex.  Taking low-dose aspirin every day.  Taking vitamin and mineral supplements as recommended by your health care provider. What happens during an annual well check? The services and screenings done by your health care provider during your annual well check will depend on your age, overall health, lifestyle risk factors, and family history of disease. Counseling  Your health care provider may ask you  questions about your:  Alcohol use.  Tobacco use.  Drug use.  Emotional well-being.  Home and relationship well-being.  Sexual activity.  Eating habits.  History of falls.  Memory and ability to understand (cognition).  Work and work Statistician.  Reproductive health. Screening  You may have the following tests or measurements:  Height, weight, and BMI.  Blood pressure.  Lipid and cholesterol levels. These may be checked every 5 years, or more frequently if you are over 69 years old.  Skin check.  Lung cancer screening. You may have this screening every year starting at age 34 if you have a 30-pack-year history of smoking and currently smoke or have quit within the past 15 years.  Fecal occult blood test (FOBT) of the stool. You may have this test every year starting at age 51.  Flexible sigmoidoscopy or colonoscopy. You may have a sigmoidoscopy every 5 years or a colonoscopy every 10 years starting at age 44.  Hepatitis C blood test.  Hepatitis B blood test.  Sexually transmitted disease (STD) testing.  Diabetes screening. This is done by checking your blood sugar (glucose) after you have not eaten for a while (fasting). You may have this done every 1-3 years.  Bone density scan. This is done to screen for osteoporosis. You may have this done starting at age 31.  Mammogram. This may be done every 1-2 years. Talk to your health care provider about how often you should have regular mammograms. Talk with your health care provider about your test results, treatment options, and if necessary, the need for more tests. Vaccines  Your health care provider may recommend certain vaccines, such as:  Influenza vaccine. This is recommended every year.  Tetanus, diphtheria,  and acellular pertussis (Tdap, Td) vaccine. You may need a Td booster every 10 years.  Zoster vaccine. You may need this after age 34.  Pneumococcal 13-valent conjugate (PCV13) vaccine. One dose is  recommended after age 61.  Pneumococcal polysaccharide (PPSV23) vaccine. One dose is recommended after age 37. Talk to your health care provider about which screenings and vaccines you need and how often you need them. This information is not intended to replace advice given to you by your health care provider. Make sure you discuss any questions you have with your health care provider. Document Released: 04/28/2015 Document Revised: 12/20/2015 Document Reviewed: 01/31/2015 Elsevier Interactive Patient Education  2017 [place] Prevention in the Home Falls can cause injuries. They can happen to people of all ages. There are many things you can do to make your home safe and to help prevent falls. What can I do on the outside of my home?  Regularly fix the edges of walkways and driveways and fix any cracks.  Remove anything that might make you trip as you walk through a door, such as a raised step or threshold.  Trim any bushes or trees on the path to your home.  Use bright outdoor lighting.  Clear any walking paths of anything that might make someone trip, such as rocks or tools.  Regularly check to see if handrails are loose or broken. Make sure that both sides of any steps have handrails.  Any raised decks and porches should have guardrails on the edges.  Have any leaves, snow, or ice cleared regularly.  Use sand or salt on walking paths during winter.  Clean up any spills in your garage right away. This includes oil or grease spills. What can I do in the bathroom?  Use night lights.  Install grab bars by the toilet and in the tub and shower. Do not use towel bars as grab bars.  Use non-skid mats or decals in the tub or shower.  If you need to sit down in the shower, use a plastic, non-slip stool.  Keep the floor dry. Clean up any water that spills on the floor as soon as it happens.  Remove soap buildup in the tub or shower regularly.  Attach bath mats  securely with double-sided non-slip rug tape.  Do not have throw rugs and other things on the floor that can make you trip. What can I do in the bedroom?  Use night lights.  Make sure that you have a light by your bed that is easy to reach.  Do not use any sheets or blankets that are too big for your bed. They should not hang down onto the floor.  Have a firm chair that has side arms. You can use this for support while you get dressed.  Do not have throw rugs and other things on the floor that can make you trip. What can I do in the kitchen?  Clean up any spills right away.  Avoid walking on wet floors.  Keep items that you use a lot in easy-to-reach places.  If you need to reach something above you, use a strong step stool that has a grab bar.  Keep electrical cords out of the way.  Do not use floor polish or wax that makes floors slippery. If you must use wax, use non-skid floor wax.  Do not have throw rugs and other things on the floor that can make you trip. What can I do with my  stairs?  Do not leave any items on the stairs.  Make sure that there are handrails on both sides of the stairs and use them. Fix handrails that are broken or loose. Make sure that handrails are as long as the stairways.  Check any carpeting to make sure that it is firmly attached to the stairs. Fix any carpet that is loose or worn.  Avoid having throw rugs at the top or bottom of the stairs. If you do have throw rugs, attach them to the floor with carpet tape.  Make sure that you have a light switch at the top of the stairs and the bottom of the stairs. If you do not have them, ask someone to add them for you. What else can I do to help prevent falls?  Wear shoes that:  Do not have high heels.  Have rubber bottoms.  Are comfortable and fit you well.  Are closed at the toe. Do not wear sandals.  If you use a stepladder:  Make sure that it is fully opened. Do not climb a closed  stepladder.  Make sure that both sides of the stepladder are locked into place.  Ask someone to hold it for you, if possible.  Clearly mark and make sure that you can see:  Any grab bars or handrails.  First and last steps.  Where the edge of each step is.  Use tools that help you move around (mobility aids) if they are needed. These include:  Canes.  Walkers.  Scooters.  Crutches.  Turn on the lights when you go into a dark area. Replace any light bulbs as soon as they burn out.  Set up your furniture so you have a clear path. Avoid moving your furniture around.  If any of your floors are uneven, fix them.  If there are any pets around you, be aware of where they are.  Review your medicines with your doctor. Some medicines can make you feel dizzy. This can increase your chance of falling. Ask your doctor what other things that you can do to help prevent falls. This information is not intended to replace advice given to you by your health care provider. Make sure you discuss any questions you have with your health care provider. Document Released: 01/26/2009 Document Revised: 09/07/2015 Document Reviewed: 05/06/2014 Elsevier Interactive Patient Education  2017 [employer].

## 2020-10-10 ENCOUNTER — Other Ambulatory Visit: Payer: Self-pay | Admitting: Family Medicine

## 2020-10-19 ENCOUNTER — Telehealth: Payer: Medicare Other | Admitting: Physician Assistant

## 2020-10-19 DIAGNOSIS — B9689 Other specified bacterial agents as the cause of diseases classified elsewhere: Secondary | ICD-10-CM

## 2020-10-19 DIAGNOSIS — J028 Acute pharyngitis due to other specified organisms: Secondary | ICD-10-CM | POA: Diagnosis not present

## 2020-10-19 MED ORDER — AMOXICILLIN 500 MG PO CAPS
500.0000 mg | ORAL_CAPSULE | Freq: Three times a day (TID) | ORAL | 0 refills | Status: DC
Start: 2020-10-19 — End: 2020-10-24

## 2020-10-19 NOTE — Progress Notes (Signed)

## 2020-10-24 ENCOUNTER — Encounter: Payer: Self-pay | Admitting: Family Medicine

## 2020-10-24 ENCOUNTER — Ambulatory Visit (INDEPENDENT_AMBULATORY_CARE_PROVIDER_SITE_OTHER): Payer: Medicare Other | Admitting: Family Medicine

## 2020-10-24 ENCOUNTER — Other Ambulatory Visit: Payer: Self-pay

## 2020-10-24 VITALS — BP 118/72 | HR 79 | Temp 97.9°F | Ht 68.0 in | Wt 171.5 lb

## 2020-10-24 DIAGNOSIS — M792 Neuralgia and neuritis, unspecified: Secondary | ICD-10-CM

## 2020-10-24 DIAGNOSIS — L74511 Primary focal hyperhidrosis, face: Secondary | ICD-10-CM

## 2020-10-24 DIAGNOSIS — M25552 Pain in left hip: Secondary | ICD-10-CM

## 2020-10-24 DIAGNOSIS — M25551 Pain in right hip: Secondary | ICD-10-CM

## 2020-10-24 DIAGNOSIS — F325 Major depressive disorder, single episode, in full remission: Secondary | ICD-10-CM | POA: Diagnosis not present

## 2020-10-24 DIAGNOSIS — E785 Hyperlipidemia, unspecified: Secondary | ICD-10-CM | POA: Diagnosis not present

## 2020-10-24 MED ORDER — GABAPENTIN 100 MG PO CAPS: 100.0000 mg | ORAL_CAPSULE | Freq: Every day | ORAL | 3 refills | Status: DC

## 2020-10-24 MED ORDER — ALUMINUM CHLORIDE 20 % EX SOLN: Freq: Every day | CUTANEOUS | 1 refills | Status: DC

## 2020-10-24 MED ORDER — GABAPENTIN 300 MG PO CAPS: 300.0000 mg | ORAL_CAPSULE | Freq: Every day | ORAL | 2 refills | Status: DC

## 2020-10-24 NOTE — Patient Instructions (Addendum)
Keep the diet clean and stay active.  Aim to do some physical exertion for 150 minutes per week. This is typically divided into 5 days per week, 30 minutes per day. The activity should be enough to get your heart rate up. Anything is better than nothing if you have time constraints.  Please consider counseling. Contact [phone] to schedule an appointment or inquire about cost/insurance coverage.  Foods that may reduce pain: 1) Ginger 2) Blueberries 3) Salmon 4) Pumpkin seeds 5) dark chocolate 6) turmeric 7) tart cherries 8) virgin olive oil 9) chilli peppers 10) mint 11) red wine  Let us know if you need anything.  Iliotibial Band Syndrome Rehab It is normal to feel mild stretching, pulling, tightness, or discomfort as you do these exercises, but you should stop right away if you feel sudden pain or your pain gets worse.  Stretching and range of motion exercises These exercises warm up your muscles and joints and improve the movement and flexibility of your hip and pelvis. Exercise A: Quadriceps, prone    Lie on your abdomen on a firm surface, such as a bed or padded floor. Bend your left / right knee and hold your ankle. If you cannot reach your ankle or pant leg, loop a belt around your foot and grab the belt instead. Gently pull your heel toward your buttocks. Your knee should not slide out to the side. You should feel a stretch in the front of your thigh and knee. Hold this position for 30 seconds. Repeat 2 times. Complete this stretch 3 times per week. Exercise B: Iliotibial band    Lie on your side with your left / right leg in the top position. Bend both of your knees and grab your left / right ankle. Stretch out your bottom arm to help you balance. Slowly bring your top knee back so your thigh goes behind your trunk. Slowly lower your top leg toward the floor until you feel a gentle stretch on the outside of your left / right hip and thigh. If you do not feel a  stretch and your knee will not fall farther, place the heel of your other foot on top of your knee and pull your knee down toward the floor with your foot. Hold this position for 30 seconds. Repeat 2 times. Complete this stretch 3 times per week. Strengthening exercises These exercises build strength and endurance in your hip and pelvis. Endurance is the ability to use your muscles for a long time, even after they get tired. Exercise C: Straight leg raises (hip abductors)     Lie on your side with your left / right leg in the top position. Lie so your head, shoulder, knee, and hip line up. You may bend your bottom knee to help you balance. Roll your hips slightly forward so your hips are stacked directly over each other and your left / right knee is facing forward. Tense the muscles in your outer thigh and lift your top leg 4-6 inches (10-15 cm). Hold this position for 3 seconds. Repeat for a total of 10 reps. Slowly return to the starting position. Let your muscles relax completely before doing another repetition. Repeat 2 times. Complete this exercise 3 times per week. Exercise D: Straight leg raises (hip extensors) Lie on your abdomen on your bed or a firm surface. You can put a pillow under your hips if that is more comfortable. Bend your left / right knee so your foot is straight up  in the air. Squeeze your buttock muscles and lift your left / right thigh off the bed. Do not let your back arch. Tense this muscle as hard as you can without increasing any knee pain. Hold this position for 2 seconds. Repeat for a total of 10 reps Slowly lower your leg to the starting position and allow it to relax completely. Repeat 2 times. Complete this exercise 3 times per week. Exercise E: Hip hike Stand sideways on a bottom step. Stand on your left / right leg with your other foot unsupported next to the step. You can hold onto the railing or wall if needed for balance. Keep your knees straight and  your torso square. Then, lift your left / right hip up toward the ceiling. Slowly let your left / right hip lower toward the floor, past the starting position. Your foot should get closer to the floor. Do not lean or bend your knees. Repeat 2 times. Complete this exercise 3 times per week.  Document Released: 04/01/2005 Document Revised: 12/05/2015 Document Reviewed: 03/03/2015 Elsevier Interactive Patient Education  2018 [place] Syndrome Rehab It is normal to feel mild stretching, pulling, tightness, or discomfort as you do these exercises, but you should stop right away if you feel sudden pain or your pain gets worse.   Stretching and range of motion exercise This exercise warms up your muscles and joints and improves the movement and flexibility of your hip and pelvis. This exercise also helps to relieve pain and stiffness. Exercise A: Lunge (hip flexor stretch)      Kneel on the floor on your left / right knee. Bend your other knee so it is directly over your ankle. Keep good posture with your head over your shoulders. Tuck your tailbone underneath you. This will prevent your back from arching too much. You should feel a gentle stretch in the front of your thigh or hip. If you do not feel a stretch, slowly lunge forward with your chest up. Hold this position for 30 seconds. Slowly return to the starting position. Repeat 2 times. Complete this exercise 3 times per week. Strengthening exercises These exercises build strength and endurance in your hip and pelvis. Endurance is the ability to use your muscles for a long time, even after they get tired. Exercise B: Bridge (hip extensors)     Lie on your back on a firm surface with your knees bent and your feet flat on the floor. Tighten your buttocks muscles and lift your bottom off the floor until the trunk of your body is level with your thighs. You should feel the muscles working in your buttocks and the back of  your thighs. If this exercise is too easy, cross your arms over your chest or lift one leg while your bottom is up off the floor. Do not arch your back. Hold this position for 3 seconds. Slowly lower your hips to the starting position. Let your muscles relax completely between repetitions. Repeat 2 times. Complete this exercise 3 times per week. Exercise C: Straight leg raises (hip abductors)     Lie on your side with your left / right leg in the top position. Lie so your head, shoulder, knee, and hip line up. Bend your bottom knee to help you balance. Lift your top leg up 4-6 inches (10-15 cm), keeping your toes pointed straight ahead. Hold this position for 2 seconds. Slowly lower your leg to the starting position and let your muscles  relax completely. Repeat for a total of 10 repetitions. Repeat 2 times. Complete this exercise 3 times per week. Exercise D: Hip abductors and external rotators, quadruped Get on your hands and knees on a firm, lightly padded surface. Your hands should be directly below your shoulders, and your knees should be directly below your hips. Lift your left / right knee out to the side. Keep your knee bent. Do not twist your body. Hold this position for 3 seconds. Slowly lower your leg. Repeat for a total of 10 repetitions.  Repeat 2 times. Complete this exercise 3 times per week. Exercise E: Single leg stand Stand near a counter or door frame to hold onto as needed. It is helpful to look in a mirror for this exercise so you can watch your hip. Squeeze your left / right buttock muscles then lift up your other foot. Do not let your left / right hip push out to the side. Hold this position for 3 seconds. Repeat for a total of 10 repetitions. Repeat 2 times. Complete this exercise 3 times per week. Make sure you discuss any questions you have with your health care provider. Document Released: 04/01/2005 Document Revised: 12/07/2015 Document Reviewed:  03/14/2015 Elsevier Interactive Patient Education  [employer].

## 2020-10-24 NOTE — Progress Notes (Signed)
Chief Complaint  Patient presents with   Follow-up    Subjective Paige Patel presents for f/u anxiety/depression.  Pt is currently being treated with Effexor XR 150 mg/d.  Reports doing well since treatment. No thoughts of harming self or others. No self-medication with alcohol, prescription drugs or illicit drugs. Pt is not following with a counselor/psychologist.  Hyperlipidemia Patient presents for dyslipidemia follow up. Currently being treated with Crestor 20 mg/d and compliance with treatment thus far has been good. She denies myalgias. She is adhering to a healthy diet. Exercise: Walking The patient is not known to have coexisting coronary artery disease. No CP or SOB.   Neuropathic Pain Taking 300 mg in the am and 100 mg in the PM of gabapentin. She is doing well.    Chronic b/l hip pain on outsides. No recent inj or change in activity. No neuro s/s's.   Sweating on forehead/cheeks. Interested in trying topical medication.   Past Medical History:  Diagnosis Date   Allergy    seasonal   Arthritis    Depression    Frequent headaches    Neuromuscular disorder ([place])    neuropathy   Osteopenia    Osteoporosis    Allergies as of 10/24/2020   No Known Allergies      Medication List        Accurate as of October 24, 2020  9:59 AM. If you have any questions, ask your nurse or doctor.          STOP taking these medications    amoxicillin 500 MG capsule Commonly known as: AMOXIL Stopped by: [name], DO       TAKE these medications    aluminum chloride 20 % external solution Commonly known as: DRYSOL Apply topically at bedtime. Started by: [name], DO   fluticasone 50 MCG/ACT nasal spray Commonly known as: FLONASE Place 2 sprays into both nostrils daily.   gabapentin 300 MG capsule Commonly known as: NEURONTIN Take 1 capsule (300 mg total) by mouth daily. Along with the 100 mg nightly dosing. What changed:   medication strength how much to take how to take this when to take this additional instructions Changed by: [name], DO   gabapentin 100 MG capsule Commonly known as: NEURONTIN Take 1 capsule (100 mg total) by mouth at bedtime. Along with 300 mg in the morning. What changed: You were already taking a medication with the same name, and this prescription was added. Make sure you understand how and when to take each. Changed by: [name], DO   methocarbamol 500 MG tablet Commonly known as: ROBAXIN TAKE 1 TABLET AS NEEDED FOR MUSCLE SPASMS AS DIRECTED   Olopatadine HCl 0.2 % Soln Apply 1 drop per eye twice daily.   omeprazole 10 MG capsule Commonly known as: PRILOSEC Take 10 mg by mouth daily.   rosuvastatin 20 MG tablet Commonly known as: CRESTOR TAKE 1 TABLET DAILY   venlafaxine XR 150 MG 24 hr capsule Commonly known as: EFFEXOR-XR TAKE 1 CAPSULE DAILY WITH BREAKFAST        Exam BP 118/72   Pulse 79   Temp 97.9 F (36.6 C) (Oral)   Ht 5\' 8"  (1.727 m)   Wt 171 lb 8 oz (77.8 kg)   SpO2 98%   BMI 26.08 kg/m  General:  well developed, well nourished, in no apparent distress Lungs:  No respiratory distress Psych: well oriented with normal range of affect and age-appropriate judgement/insight, alert  and oriented x4.  Assessment and Plan  Dyslipidemia  Major depressive disorder with single episode, in full remission ([place])  Neuropathic pain  Hyperhidrosis of face  Bilateral hip pain  Chronic, stable.  Continue Crestor 20 mg daily. Chronic, stable.  Continue venlafaxine XR 150 mg daily. Chronic, stable.  Continue gabapentin 300 mg in the morning and 100 mg in the evening. Trial Drysol.  Trial stretches and exercises for the gluteus medius and IT band.  If no improvement, will consider injection versus PT. F/u in 6 mo or prn. The patient voiced understanding and agreement to the plan.  [place], DO 10/24/20 9:59  AM

## 2020-11-27 DIAGNOSIS — H16223 Keratoconjunctivitis sicca, not specified as Sjogren's, bilateral: Secondary | ICD-10-CM | POA: Diagnosis not present

## 2020-12-12 IMAGING — MG DIGITAL SCREENING BILAT W/ TOMO W/ CAD
8 series · 8 of 24 positions shown · non-contrast
Comparison: Previous exam(s).

CLINICAL DATA: Screening.

EXAM:
DIGITAL SCREENING BILATERAL MAMMOGRAM WITH TOMO AND CAD

[R CC synth-2D]
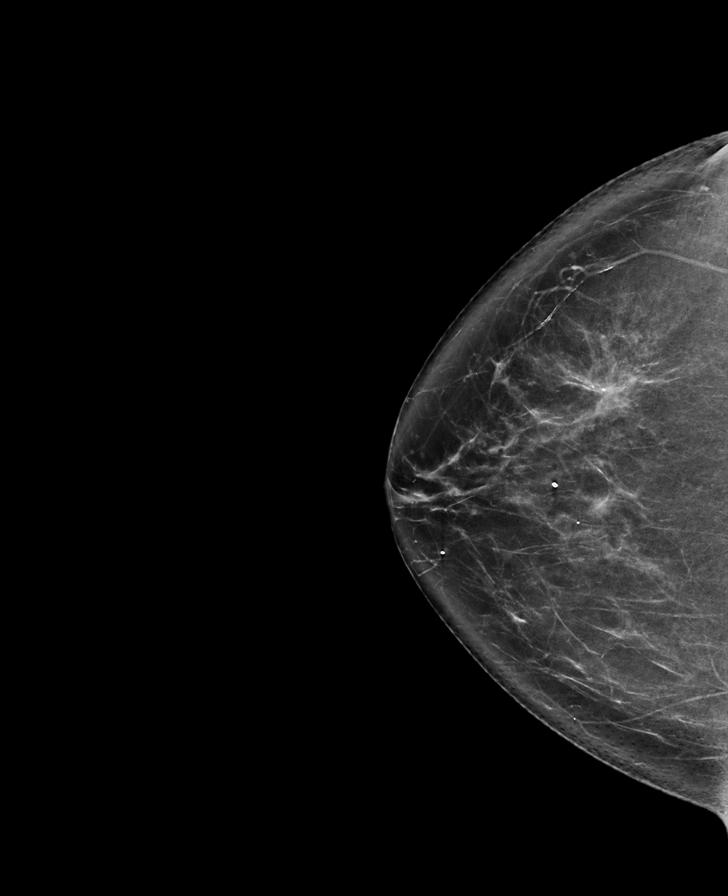

[L CC synth-2D]
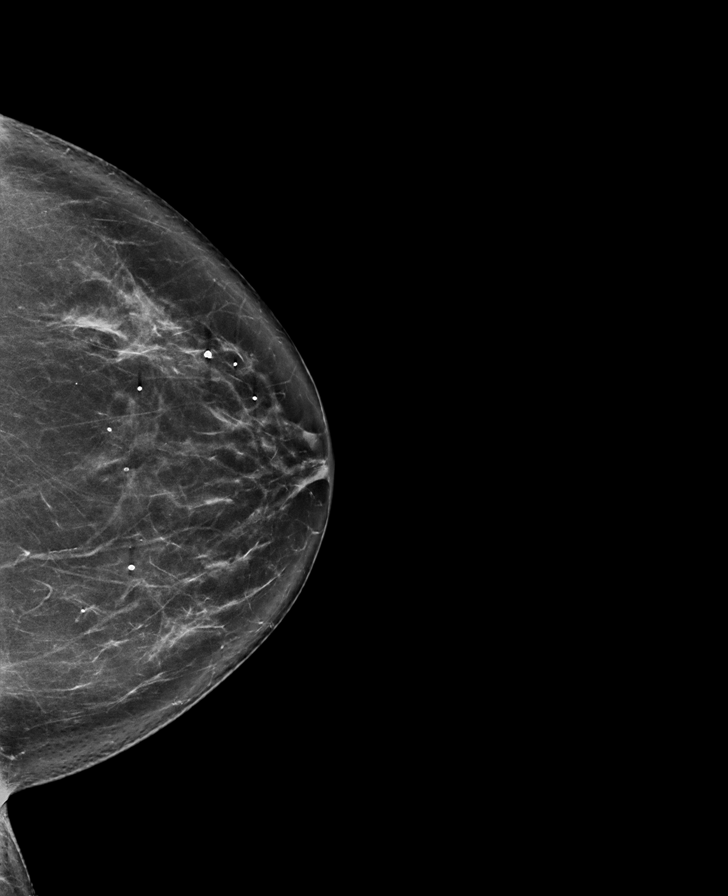

[R MLO synth-2D]
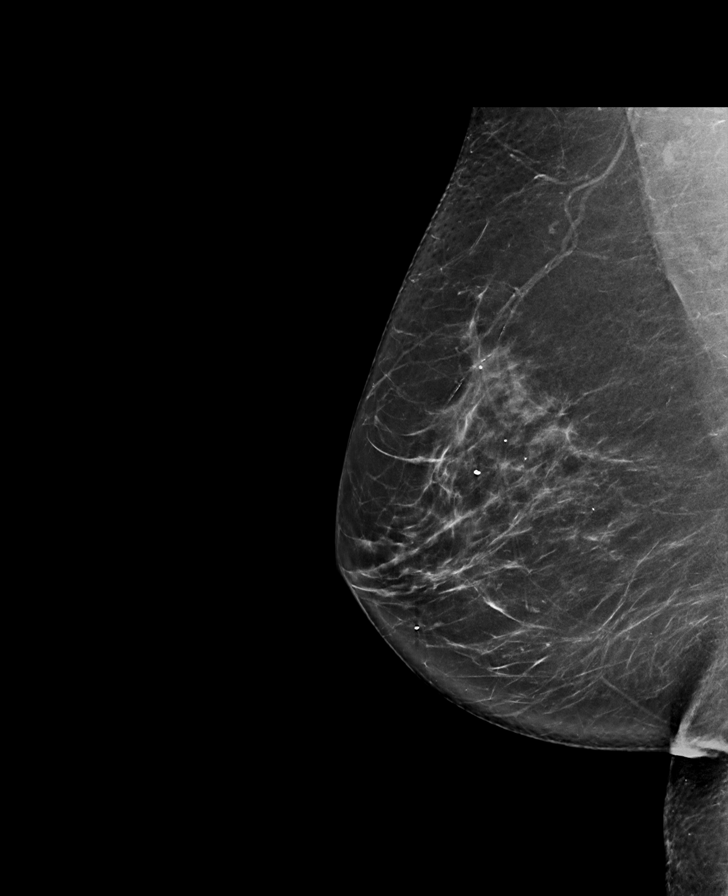

[L MLO synth-2D]
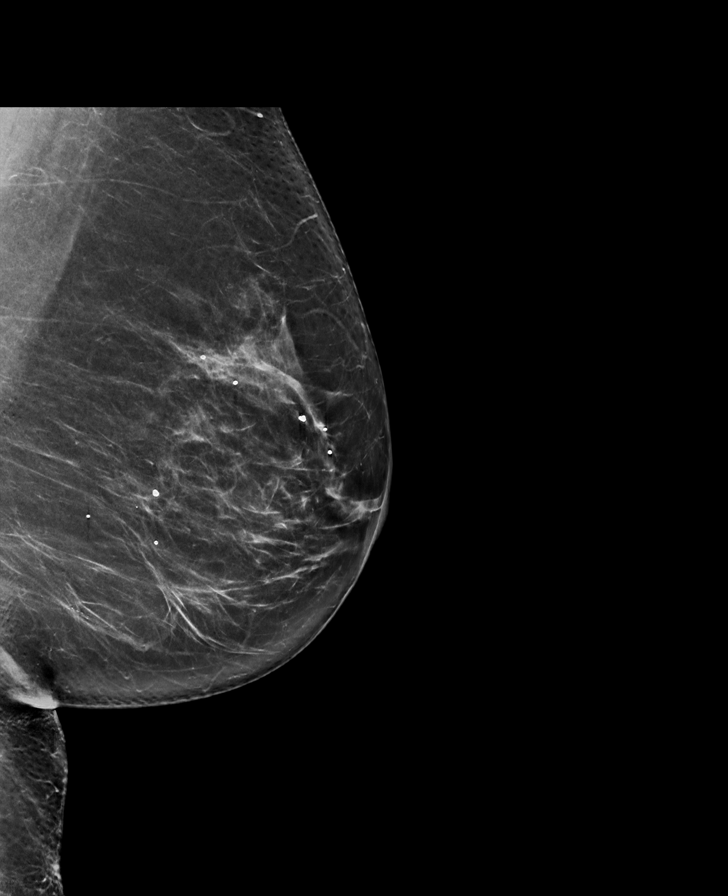

[L MLO tomo · tomo slice 42/83.0]
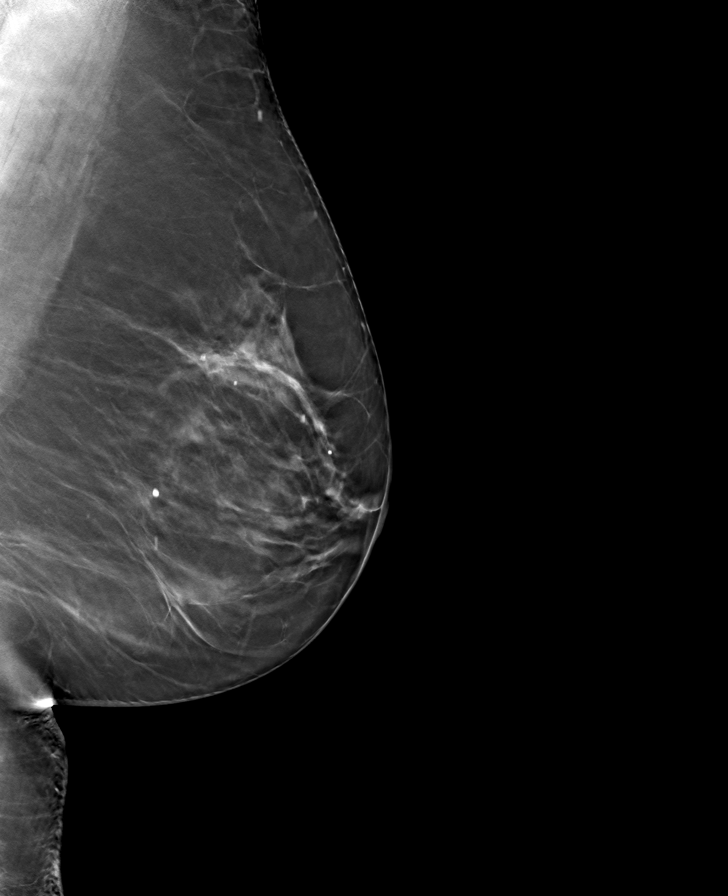

[R MLO tomo · tomo slice 41/81.0]
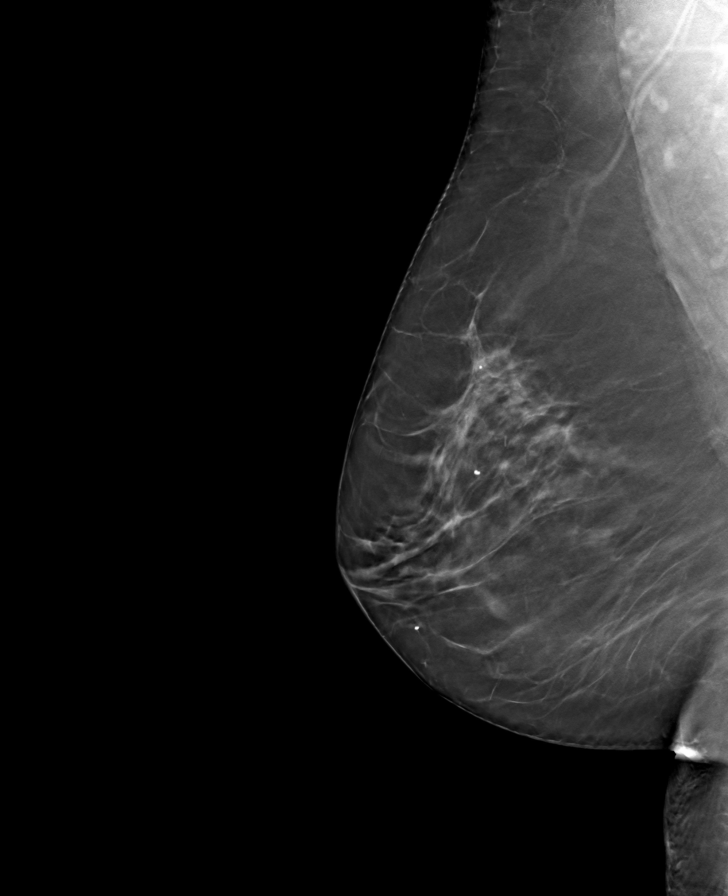

[R CC tomo · tomo slice 41/82.0]
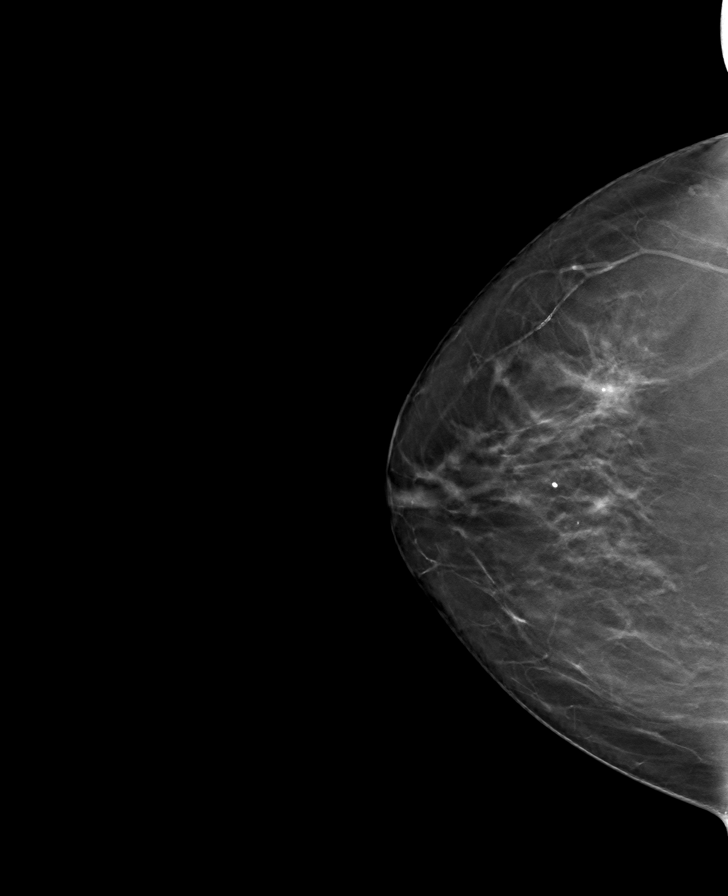

[L CC tomo · tomo slice 41/81.0]
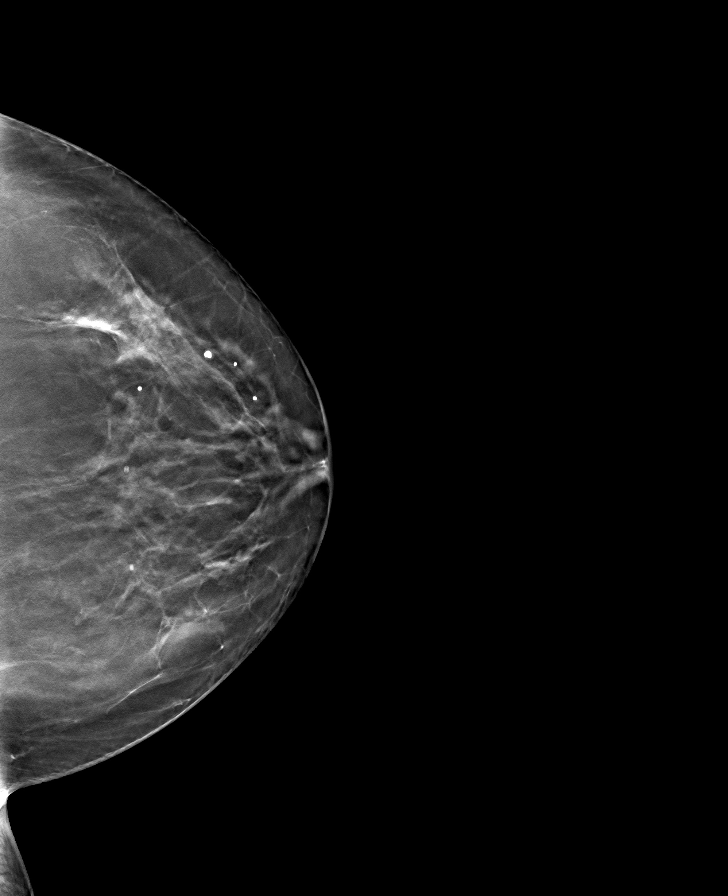

[8 of 24 positions shown; findings below may reference images not displayed]

ACR Breast Density Category b: There are scattered areas of
fibroglandular density.
FINDINGS: There are no findings suspicious for malignancy. Images were
processed with CAD.
IMPRESSION: No mammographic evidence of malignancy. A result letter of this
screening mammogram will be mailed directly to the patient.

RECOMMENDATION:
Screening mammogram in one year. (Code:[id number])

BI-RADS CATEGORY  1: Negative.

## 2021-01-19 DIAGNOSIS — Z23 Encounter for immunization: Secondary | ICD-10-CM | POA: Diagnosis not present

## 2021-02-05 ENCOUNTER — Other Ambulatory Visit: Payer: Self-pay | Admitting: Family Medicine

## 2021-04-19 ENCOUNTER — Ambulatory Visit (INDEPENDENT_AMBULATORY_CARE_PROVIDER_SITE_OTHER): Payer: Medicare Other | Admitting: Orthopaedic Surgery

## 2021-04-19 ENCOUNTER — Ambulatory Visit (HOSPITAL_BASED_OUTPATIENT_CLINIC_OR_DEPARTMENT_OTHER)
Admission: RE | Admit: 2021-04-19 | Discharge: 2021-04-19 | Disposition: A | Discharge status: Home / Self Care | Payer: Medicare Other | Source: Ambulatory Visit | Attending: Orthopaedic Surgery | Admitting: Orthopaedic Surgery

## 2021-04-19 ENCOUNTER — Other Ambulatory Visit (HOSPITAL_BASED_OUTPATIENT_CLINIC_OR_DEPARTMENT_OTHER): Payer: Self-pay | Admitting: Orthopaedic Surgery

## 2021-04-19 ENCOUNTER — Other Ambulatory Visit: Payer: Self-pay

## 2021-04-19 DIAGNOSIS — M25559 Pain in unspecified hip: Secondary | ICD-10-CM

## 2021-04-19 DIAGNOSIS — S76011A Strain of muscle, fascia and tendon of right hip, initial encounter: Secondary | ICD-10-CM | POA: Diagnosis not present

## 2021-04-19 DIAGNOSIS — M25551 Pain in right hip: Secondary | ICD-10-CM

## 2021-04-19 MED ORDER — TRIAMCINOLONE ACETONIDE 40 MG/ML IJ SUSP
80.0000 mg | INTRAMUSCULAR | Status: AC | PRN
  Administered 2021-04-19: 80 mg via INTRA_ARTICULAR

## 2021-04-19 MED ORDER — LIDOCAINE HCL 1 % IJ SOLN
4.0000 mL | INTRAMUSCULAR | Status: AC | PRN
  Administered 2021-04-19: 4 mL

## 2021-04-19 NOTE — Progress Notes (Signed)
Chief Complaint: right hip pain     History of Present Illness:    Paige Patel is a 71 y.o. female with right hip pain now going on for several years.  This has been worse over the last several years.  She states that on Christmas Eve she can barely stand.  She is having a difficult time with standing up on this right side.  Denies any clicking or popping.  Denies any groin pain.  She has difficult time laying directly on that side.  She has a difficult time with stairs.  She is taking ibuprofen which helps somewhat    Surgical History:   None  PMH/PSH/Family History/Social History/Meds/Allergies:    Past Medical History:  Diagnosis Date   Allergy    seasonal   Arthritis    Depression    Frequent headaches    Neuromuscular disorder (HCC)    neuropathy   Osteopenia    Osteoporosis    Past Surgical History:  Procedure Laterality Date   COLONOSCOPY  > 10 yrs   MANDIBLE SURGERY     OTHER SURGICAL HISTORY  1998   jaw surgery, balanced it   Social History   Socioeconomic History   Marital status: Widowed    Spouse name: Not on file   Number of children: Not on file   Years of education: Not on file   Highest education level: Not on file  Occupational History   Not on file  Tobacco Use   Smoking status: Never   Smokeless tobacco: Never  Vaping Use   Vaping Use: Never used  Substance and Sexual Activity   Alcohol use: Yes    Comment: seldom   Drug use: No   Sexual activity: Not on file  Other Topics Concern   Not on file  Social History Narrative   Not on file   Social Determinants of Health   Financial Resource Strain: Low Risk    Difficulty of Paying Living Expenses: Not hard at all  Food Insecurity: No Food Insecurity   Worried About Running Out of Food in the Last Year: Never true   [place] in the Last Year: Never true  Transportation Needs: No Transportation Needs   Lack of  Transportation (Medical): No   Lack of Transportation (Non-Medical): No  Physical Activity: Sufficiently Active   Days of Exercise per Week: 7 days   Minutes of Exercise per Session: 30 min  Stress: No Stress Concern Present   Feeling of Stress : Not at all  Social Connections: Socially Integrated   Frequency of Communication with Friends and Family: More than three times a week   Frequency of Social Gatherings with Friends and Family: More than three times a week   Attends Religious Services: 1 to 4 times per year   Active Member of [employer] or Organizations: Yes   Attends Archivist Meetings: 1 to 4 times per year   Marital Status: Married   Family History  Problem Relation Age of Onset   Alzheimer's disease Mother 73   High Cholesterol Mother    Cancer Father        Lung   High Cholesterol Father    Hypertension Brother    Breast cancer Paternal Aunt    Colon cancer Neg Hx  Colon polyps Neg Hx    Esophageal cancer Neg Hx    Rectal cancer Neg Hx    Stomach cancer Neg Hx    No Known Allergies Current Outpatient Medications  Medication Sig Dispense Refill   aluminum chloride (DRYSOL) 20 % external solution Apply topically at bedtime. 60 mL 1   fluticasone (FLONASE) 50 MCG/ACT nasal spray Place 2 sprays into both nostrils daily. 48 g 2   gabapentin (NEURONTIN) 100 MG capsule TAKE 1 CAPSULE IN THE MORNING AND 2 CAPSULES IN THE EVENING 270 capsule 1   gabapentin (NEURONTIN) 300 MG capsule Take 1 capsule (300 mg total) by mouth daily. Along with the 100 mg nightly dosing. 90 capsule 2   methocarbamol (ROBAXIN) 500 MG tablet TAKE 1 TABLET AS NEEDED FOR MUSCLE SPASMS AS DIRECTED 90 tablet 3   Olopatadine HCl 0.2 % SOLN Apply 1 drop per eye twice daily. 7.5 mL 0   omeprazole (PRILOSEC) 10 MG capsule Take 10 mg by mouth daily.     rosuvastatin (CRESTOR) 20 MG tablet TAKE 1 TABLET DAILY 90 tablet 3   venlafaxine XR (EFFEXOR-XR) 150 MG 24 hr  capsule TAKE 1 CAPSULE DAILY WITH BREAKFAST 90 capsule 3   No current facility-administered medications for this visit.   No results found.  Review of Systems:   A ROS was performed including pertinent positives and negatives as documented in the HPI.  Physical Exam :   Constitutional: NAD and appears stated age Neurological: Alert and oriented Psych: Appropriate affect and cooperative There were no vitals taken for this visit.   Comprehensive Musculoskeletal Exam:    Inspection Right Left  Skin No atrophy or gross abnormalities appreciated No atrophy or gross abnormalities appreciated  Palpation    Tenderness None None  Crepitus None None  Range of Motion    Flexion (passive) 120 120  Extension 30 30  IR 30 30  ER 45 45  Strength    Flexion  5/5 5/5  Extension 5/5 5/5  Special Tests    FABIR Negative Negative  FADER Negative Negative  ER Lag/Capsular Insufficiency Negative Negative  Instability Negative Negative  Sacroiliac pain Negative  Negative   Instability    Generalized Laxity No No  Neurologic    sciatic, femoral, obturator nerves intact to light sensation  Vascular/Lymphatic    DP pulse 2+ 2+  Lumbar Exam    Patient has symmetric lumbar range of motion with negative pain referral to hip  Positive weakness with resisted abduction, weakness with standing leg squat on the side   Imaging:   Xray (3 views AP pelvis 2 views right hip): Normal   I personally reviewed and interpreted the radiographs.   Assessment:   71 year old female with right likely gluteus medius tendinitis versus tendinosis versus tearing.  At this time I would recommend an ultrasound-guided injection in order to give her pain relief as well as diagnostically.  I would like to prescribe her physical therapy so that she can work on obtaining a home gluteus medius strengthening program.  I will see her back in 2 months for reassessment  Plan :    -Return to clinic in 2  months    Procedure Note  Patient: Paige Patel             Date of Birth: Feb 24, 1951           MRN: [id number]             Visit Date: 04/19/2021  Procedures: Visit  Diagnoses: No diagnosis found.  Large Joint Inj: R greater trochanter on 04/19/2021 3:03 PM Indications: pain Details: 22 G 3.5 in needle, ultrasound-guided anterolateral approach  Arthrogram: No  Medications: 4 mL lidocaine 1 %; 80 mg triamcinolone acetonide 40 MG/ML Outcome: tolerated well, no immediate complications Procedure, treatment alternatives, risks and benefits explained, specific risks discussed. Consent was given by the patient. Immediately prior to procedure a time out was called to verify the correct patient, procedure, equipment, support staff and site/side marked as required. Patient was prepped and draped in the usual sterile fashion.          I personally saw and evaluated the patient, and participated in the management and treatment plan.  Vanetta Mulders, MD Attending Physician, Orthopedic Surgery  This document was dictated using Dragon voice recognition software. A reasonable attempt at proof reading has been made to minimize errors.

## 2021-04-27 ENCOUNTER — Ambulatory Visit (INDEPENDENT_AMBULATORY_CARE_PROVIDER_SITE_OTHER): Payer: Medicare Other | Admitting: Family Medicine

## 2021-04-27 ENCOUNTER — Encounter: Payer: Self-pay | Admitting: Family Medicine

## 2021-04-27 ENCOUNTER — Other Ambulatory Visit: Payer: Self-pay | Admitting: Family Medicine

## 2021-04-27 VITALS — BP 120/70 | HR 67 | Temp 97.9°F | Ht 68.0 in | Wt 166.4 lb

## 2021-04-27 DIAGNOSIS — E785 Hyperlipidemia, unspecified: Secondary | ICD-10-CM

## 2021-04-27 DIAGNOSIS — M8580 Other specified disorders of bone density and structure, unspecified site: Secondary | ICD-10-CM | POA: Diagnosis not present

## 2021-04-27 DIAGNOSIS — E559 Vitamin D deficiency, unspecified: Secondary | ICD-10-CM | POA: Diagnosis not present

## 2021-04-27 DIAGNOSIS — F325 Major depressive disorder, single episode, in full remission: Secondary | ICD-10-CM | POA: Diagnosis not present

## 2021-04-27 DIAGNOSIS — R748 Abnormal levels of other serum enzymes: Secondary | ICD-10-CM

## 2021-04-27 LAB — COMPREHENSIVE METABOLIC PANEL
ALT: 17 U/L (ref 0–35)
AST: 16 U/L (ref 0–37)
Albumin: 4.9 g/dL (ref 3.5–5.2)
Alkaline Phosphatase: 118 U/L — ABNORMAL HIGH (ref 39–117)
BUN: 16 mg/dL (ref 6–23)
CO2: 28 mEq/L (ref 19–32)
Calcium: 10.1 mg/dL (ref 8.4–10.5)
Chloride: 102 mEq/L (ref 96–112)
Creatinine, Ser: 0.9 mg/dL (ref 0.40–1.20)
GFR: 64.95 mL/min (ref >60.00)
Glucose, Bld: 102 mg/dL — ABNORMAL HIGH (ref 70–99)
Potassium: 4.6 mEq/L (ref 3.5–5.1)
Sodium: 140 mEq/L (ref 135–145)
Total Bilirubin: 0.6 mg/dL (ref 0.2–1.2)
Total Protein: 7.1 g/dL (ref 6.0–8.3)

## 2021-04-27 LAB — LIPID PANEL
Cholesterol: 184 mg/dL (ref 0–200)
HDL: 77.7 mg/dL (ref >39.00)
LDL Cholesterol: 81 mg/dL (ref 0–99)
NonHDL: 106.72
Total CHOL/HDL Ratio: 2
Triglycerides: 130 mg/dL (ref 0.0–149.0)
VLDL: 26 mg/dL (ref 0.0–40.0)

## 2021-04-27 LAB — VITAMIN D 25 HYDROXY (VIT D DEFICIENCY, FRACTURES): VITD: 32.62 ng/mL (ref 30.00–100.00)

## 2021-04-27 MED ORDER — GABAPENTIN 100 MG PO CAPS: ORAL_CAPSULE | ORAL | 1 refills | Status: DC

## 2021-04-27 MED ORDER — METHOCARBAMOL 500 MG PO TABS: ORAL_TABLET | ORAL | 3 refills | Status: DC

## 2021-04-27 MED ORDER — ROSUVASTATIN CALCIUM 20 MG PO TABS: 20.0000 mg | ORAL_TABLET | Freq: Every day | ORAL | 3 refills | Status: DC

## 2021-04-27 NOTE — Progress Notes (Signed)
Chief Complaint  Patient presents with   Medical Management of Chronic Issues    6 m f/u (just lost husband this past Nov)    Subjective Paige Patel presents for f/u anxiety/depression.  Pt is currently being treated with Effexor XR 150 mg/d.  Reports doing well since treatment. No thoughts of harming self or others. No self-medication with alcohol, prescription drugs or illicit drugs. Pt is not following with a counselor/psychologist.  Dyslipidemia Patient presents for dyslipidemia follow up. Currently being treated with Crestor 20 mg/d and compliance with treatment thus far has been good. She denies myalgias. She is adhering to a healthy diet. Exercise: tearing up carpet The patient is not known to have coexisting coronary artery disease.  Past Medical History:  Diagnosis Date   Allergy    seasonal   Arthritis    Depression    Frequent headaches    Neuromuscular disorder ([place])    neuropathy   Osteopenia    Osteoporosis    Allergies as of 04/27/2021   No Known Allergies      Medication List        Accurate as of April 27, 2021  7:34 AM. If you have any questions, ask your nurse or doctor.          STOP taking these medications    aluminum chloride 20 % external solution Commonly known as: DRYSOL Stopped by: [name], DO       TAKE these medications    fluticasone 50 MCG/ACT nasal spray Commonly known as: FLONASE Place 2 sprays into both nostrils daily.   gabapentin 300 MG capsule Commonly known as: NEURONTIN Take 1 capsule (300 mg total) by mouth daily. Along with the 100 mg nightly dosing.   gabapentin 100 MG capsule Commonly known as: NEURONTIN TAKE 1 CAPSULE IN THE MORNING AND 2 CAPSULES IN THE EVENING   methocarbamol 500 MG tablet Commonly known as: ROBAXIN TAKE 1 TABLET AS NEEDED FOR MUSCLE SPASMS AS DIRECTED   Olopatadine HCl 0.2 % Soln Apply 1 drop per eye twice daily.   omeprazole 10 MG capsule Commonly known  as: PRILOSEC Take 10 mg by mouth daily.   rosuvastatin 20 MG tablet Commonly known as: CRESTOR Take 1 tablet (20 mg total) by mouth daily.   venlafaxine XR 150 MG 24 hr capsule Commonly known as: EFFEXOR-XR TAKE 1 CAPSULE DAILY WITH BREAKFAST        Exam BP 120/70    Pulse 67    Temp 97.9 F (36.6 C) (Oral)    Ht 5\' 8"  (1.727 m)    Wt 166 lb 6.4 oz (75.5 kg)    SpO2 96%    BMI 25.30 kg/m  General:  well developed, well nourished, in no apparent distress Heart: RRR Lungs:  CTAB. No respiratory distress Psych: well oriented with normal range of affect and age-appropriate judgement/insight, alert and oriented x4.  Assessment and Plan  Major depressive disorder with single episode, in full remission ([place])  Dyslipidemia  Chronic, stable. Cont Effexor XR 150 mg/d. Politely declined counseling services.  Chronic, stable. Cont Crestor 20 mg/d. Counseled on diet/exercise.  F/u in 6 mo. The patient voiced understanding and agreement to the plan.  [place], DO 04/27/21 7:34 AM

## 2021-04-27 NOTE — Patient Instructions (Signed)
Give us 2-3 business days to get the results of your labs back.   Keep the diet clean and stay active.  Let us know if you need anything. 

## 2021-05-15 ENCOUNTER — Encounter: Payer: Self-pay | Admitting: Family Medicine

## 2021-05-17 ENCOUNTER — Other Ambulatory Visit: Payer: Medicare Other

## 2021-05-22 ENCOUNTER — Other Ambulatory Visit: Payer: Self-pay

## 2021-05-22 ENCOUNTER — Ambulatory Visit (HOSPITAL_BASED_OUTPATIENT_CLINIC_OR_DEPARTMENT_OTHER): Payer: Medicare Other | Attending: Orthopaedic Surgery | Admitting: Physical Therapy

## 2021-05-22 ENCOUNTER — Encounter (HOSPITAL_BASED_OUTPATIENT_CLINIC_OR_DEPARTMENT_OTHER): Payer: Self-pay | Admitting: Physical Therapy

## 2021-05-22 DIAGNOSIS — M25551 Pain in right hip: Secondary | ICD-10-CM | POA: Insufficient documentation

## 2021-05-22 DIAGNOSIS — R2689 Other abnormalities of gait and mobility: Secondary | ICD-10-CM | POA: Diagnosis not present

## 2021-05-22 DIAGNOSIS — X58XXXA Exposure to other specified factors, initial encounter: Secondary | ICD-10-CM | POA: Insufficient documentation

## 2021-05-22 DIAGNOSIS — S76011A Strain of muscle, fascia and tendon of right hip, initial encounter: Secondary | ICD-10-CM | POA: Diagnosis not present

## 2021-05-22 NOTE — Therapy (Signed)
OUTPATIENT PHYSICAL THERAPY LOWER EXTREMITY EVALUATION   Patient Name: Paige Patel MRN: [id number] DOB:1950/08/26, 71 y.o., female Today's Date: 05/22/2021    Past Medical History:  Diagnosis Date   Allergy    seasonal   Arthritis    Depression    Frequent headaches    Neuromuscular disorder ([place])    neuropathy   Osteopenia    Osteoporosis    Past Surgical History:  Procedure Laterality Date   COLONOSCOPY  > 10 yrs   MANDIBLE SURGERY     OTHER SURGICAL HISTORY  1998   jaw surgery, balanced it   Patient Active Problem List   Diagnosis Date Noted   Neuropathic pain 12/06/2019   Medicare annual wellness visit, subsequent 05/15/2018   Dyslipidemia 11/12/2017   Osteopenia    Estrogen deficiency 05/07/2017   Depression     PCP: [name], DO  REFERRING PROVIDER: Vanetta Mulders, MD  REFERRING DIAG: [phone] (ICD-10-CM) - Tear of right gluteus medius tendon, initial encounter  THERAPY DIAG:  Right Hip Pain   ONSET DATE: Has had pain for 40 years but around Christmas had a significant increase in pain.   SUBJECTIVE:   SUBJECTIVE STATEMENT: Patient has had a long history of right hip pain.  Around Christmas the pain increased significantly. She had a shot on 1/5 which helped her pain significantly. She is still having pain while lying on it and at times when putting weight on the leg.   PERTINENT HISTORY:   PAIN:  Are you having pain? Yes NPRS scale: 4/10 Pain location: Deep int he gluteal  Pain orientation: Right  PAIN TYPE: aching Pain description: intermittent  Aggravating factors: Standing/ walking; lying on it / Sitting  Relieving factors: rest   PRECAUTIONS: None  WEIGHT BEARING RESTRICTIONS No  FALLS:  Has patient fallen in last 6 months? No, Number of falls:   LIVING ENVIRONMENT: Has steps into her home. She can feel it at times. She has a stair lift if needed.   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 Walk  her dogs   PLOF: Independent  PATIENT GOALS   To have less pain/ to ride her bike and kyack/ to not have this pain happen again    OBJECTIVE:   DIAGNOSTIC FINDINGS: X-ray (-)   PATIENT SURVEYS:  FOTO    COGNITION:  Overall cognitive status: Within functional limits for tasks assessed     SENSATION:  Pain in the top of both feet 2nd to peripheral nueropathy: Well controlled by gabapentin MUSCLE LENGTH:  POSTURE:  Mild forward head   PALPATION: Patient can palpate deep enough to feel it but therapy could not palpate her trigger point. She could feel it with the tennis ball.   LE AROM/PROM:  Mild pain with end range right hip flexion but full motion.  Lumbar flexion:  90 degrees without pain Rotation /side bending both full  LE MMT:  MMT Right 05/22/2021 Left 05/22/2021  Hip flexion 19.6 19.8  Hip extension    Hip abduction 22.6 24.5  Hip adduction    Hip internal rotation    Hip external rotation    Knee flexion    Knee extension 29.8 28.5  Ankle dorsiflexion    Ankle plantarflexion    Ankle inversion    Ankle eversion     (Blank rows = not tested)   GAIT: Mild lateral shift to the right in single leg stance    TODAY'S TREATMENT: Exercises Supine Piriformis Stretch with  Foot on Ground - 1 x daily - 7 x weekly - 3 sets - 10 reps Standing Glute Med Mobilization with Small Ball on Wall - 1 x daily - 7 x weekly - 3 sets - 1-2 min hold Supine Bridge - 1 x daily - 7 x weekly - 3 sets - 10 reps Hooklying Clamshell with Resistance - 1 x daily - 7 x weekly - 3 sets - 10 reps    PATIENT EDUCATION:  Education details: reviewed HEP, POC, symptom management  Person educated: Patient Education method: Explanation, Demonstration, Tactile cues, and Verbal cues Education comprehension: verbalized understanding   HOME EXERCISE PROGRAM: Access Code: [email or web address] URL: [email or web address] Date: 05/22/2021 Prepared by: Carolyne Littles  Exercises Supine  Piriformis Stretch with Foot on Ground - 1 x daily - 7 x weekly - 3 sets - 10 reps Standing Glute Med Mobilization with Small Ball on Wall - 1 x daily - 7 x weekly - 3 sets - 1-2 min hold Supine Bridge - 1 x daily - 7 x weekly - 3 sets - 10 reps Hooklying Clamshell with Resistance - 1 x daily - 7 x weekly - 3 sets - 10 reps   ASSESSMENT:  CLINICAL IMPRESSION: Patient is a 71 year old female with right hip pain. Her hip pain is chronic but she had a recent flair. She had an injection a few days ago which has cleared up much of her pain. She continues to have pain when standing and sitting at times. She had a long ride yesterday which exacerbated her pain. She has mild gluteal weakness on the right compared to the left. She also has pain with end range    Objective impairments include Abnormal gait, decreased activity tolerance, decreased endurance, difficulty walking, decreased ROM, decreased strength, and pain. These impairments are limiting patient from cleaning, driving, yard work, and shopping. Personal factors including 1-2 comorbidities: peripheral neuropathy; depression   are also affecting patient's functional outcome. Patient will benefit from skilled PT to address above impairments and improve overall function.  REHAB POTENTIAL: Excellent  CLINICAL DECISION MAKING: Stable/uncomplicated  EVALUATION COMPLEXITY: Low   GOALS: Goals reviewed with patient? Yes  SHORT TERM GOALS:  STG Name Target Date Goal status  1 Patient will demonstrate full passive right hip flexion  Baseline:  06/13/2021 INITIAL  2 Patient will increase bilateral hip abduction strength by 5 lbs bilateral  Baseline:  06/13/2021 INITIAL  3 Patient will be independent with a basic HEP  Baseline: 06/13/2021 INITIAL  LONG TERM GOALS:   LTG Name Target Date Goal status  1 Patient will sit for 1 hour without pain in order to go on longer car rides  Baseline: 07/04/2021 INITIAL  2 Patient will ambulate 1 mile without  increased pain Baseline: 07/04/2021 INITIAL  3 Patient will be independent with complete exercise  Baseline: 07/04/2021 INITIAL  PLAN: PT FREQUENCY: 1x/week  PT DURATION: 6 weeks  PLANNED INTERVENTIONS: Therapeutic exercises, Therapeutic activity, Neuro Muscular re-education, Gait training, Patient/Family education, Joint mobilization, Dry Needling, Electrical stimulation, Cryotherapy, Moist heat, and Manual therapy  PLAN FOR NEXT SESSION: advance strengthening exercises. Review stretching and self soft tissue mobilization   Carney Living, PT 05/22/2021, 1:51 PM

## 2021-05-23 ENCOUNTER — Encounter (HOSPITAL_BASED_OUTPATIENT_CLINIC_OR_DEPARTMENT_OTHER): Payer: Self-pay | Admitting: Physical Therapy

## 2021-05-24 ENCOUNTER — Other Ambulatory Visit (INDEPENDENT_AMBULATORY_CARE_PROVIDER_SITE_OTHER): Payer: Medicare Other

## 2021-05-24 DIAGNOSIS — R748 Abnormal levels of other serum enzymes: Secondary | ICD-10-CM

## 2021-05-24 LAB — HEPATIC FUNCTION PANEL
ALT: 20 U/L (ref 0–35)
AST: 19 U/L (ref 0–37)
Albumin: 4.6 g/dL (ref 3.5–5.2)
Alkaline Phosphatase: 113 U/L (ref 39–117)
Bilirubin, Direct: 0.1 mg/dL (ref 0.0–0.3)
Total Bilirubin: 0.5 mg/dL (ref 0.2–1.2)
Total Protein: 7.1 g/dL (ref 6.0–8.3)

## 2021-05-24 LAB — GAMMA GT: GGT: 24 U/L (ref 7–51)

## 2021-05-27 LAB — ALKALINE PHOSPHATASE ISOENZYMES
Alkaline phosphatase (APISO): 111 U/L (ref 37–153)
Bone Isoenzymes: 52 % (ref 28–66)
Intestinal Isoenzymes: 0 % — ABNORMAL LOW (ref 1–24)
Liver Isoenzymes: 48 % (ref 25–69)
Macrohepatic isoenzymes: 0 % (ref <=0)
Placental isoenzymes: 0 % (ref <=0)

## 2021-05-30 ENCOUNTER — Other Ambulatory Visit: Payer: Self-pay | Admitting: Family Medicine

## 2021-05-30 MED ORDER — GABAPENTIN 300 MG PO CAPS: 300.0000 mg | ORAL_CAPSULE | Freq: Every day | ORAL | 2 refills | Status: DC

## 2021-06-06 ENCOUNTER — Other Ambulatory Visit: Payer: Self-pay

## 2021-06-06 ENCOUNTER — Encounter (HOSPITAL_BASED_OUTPATIENT_CLINIC_OR_DEPARTMENT_OTHER): Payer: Self-pay | Admitting: Physical Therapy

## 2021-06-06 ENCOUNTER — Ambulatory Visit (HOSPITAL_BASED_OUTPATIENT_CLINIC_OR_DEPARTMENT_OTHER): Payer: Medicare Other | Admitting: Physical Therapy

## 2021-06-06 DIAGNOSIS — R2689 Other abnormalities of gait and mobility: Secondary | ICD-10-CM

## 2021-06-06 DIAGNOSIS — M25551 Pain in right hip: Secondary | ICD-10-CM

## 2021-06-06 DIAGNOSIS — S76011A Strain of muscle, fascia and tendon of right hip, initial encounter: Secondary | ICD-10-CM | POA: Diagnosis not present

## 2021-06-06 NOTE — Therapy (Addendum)
OUTPATIENT PHYSICAL THERAPY LOWER EXTREMITY TREATMENT   Patient Name: Paige Patel MRN: [id number] DOB:March 09, 1951, 71 y.o., female Today's Date: 06/06/2021   PT End of Session - 06/06/21 1516     Visit Number 2    Number of Visits 6    Date for PT Re-Evaluation 07/03/21    PT Start Time 3151    PT Stop Time 1557    PT Time Calculation (min) 40 min    Activity Tolerance Patient tolerated treatment well    Behavior During Therapy [facility] for tasks assessed/performed             Past Medical History:  Diagnosis Date   Allergy    seasonal   Arthritis    Depression    Frequent headaches    Neuromuscular disorder ([place])    neuropathy   Osteopenia    Osteoporosis    Past Surgical History:  Procedure Laterality Date   COLONOSCOPY  > 10 yrs   MANDIBLE SURGERY     OTHER SURGICAL HISTORY  1998   jaw surgery, balanced it   Patient Active Problem List   Diagnosis Date Noted   Neuropathic pain 12/06/2019   Medicare annual wellness visit, subsequent 05/15/2018   Dyslipidemia 11/12/2017   Osteopenia    Estrogen deficiency 05/07/2017   Depression     PCP: [name], DO  REFERRING PROVIDER: [name]*  REFERRING DIAG: V61.607P (ICD-10-CM) - Tear of right gluteus medius tendon, initial encounter  THERAPY DIAG:  Right Hip Pain   ONSET DATE: Has had pain for 40 years but around Christmas had a significant increase in pain.   SUBJECTIVE:   SUBJECTIVE STATEMENT: Pt reports that sitting continues to bother her Rt hip.  She has adjusted her car seat to tolerate driving.  She is doing exercises 1x/day. She is reporting some reduction of pain so far.   PERTINENT HISTORY:   PAIN:  Are you having pain? yes NPRS scale: 1-2/10  Pain location: Deep in he gluteal  Pain orientation: Right  PAIN TYPE: aching Pain description: intermittent  Aggravating factors: Standing/ walking up hill; lying on it / Sitting  Relieving factors: rest   PRECAUTIONS:  None  WEIGHT BEARING RESTRICTIONS No  FALLS:  Has patient fallen in last 6 months? No, Number of falls:   LIVING ENVIRONMENT: Has steps into her home. She can feel it at times. She has a stair lift if needed.   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 Walk her dogs   PLOF: Independent  PATIENT GOALS   To have less pain/ to ride her bike and kyack/ to not have this pain happen again    OBJECTIVE:   DIAGNOSTIC FINDINGS: X-ray (-)   PATIENT SURVEYS:  FOTO    COGNITION:  Overall cognitive status: Within functional limits for tasks assessed     SENSATION:  Pain in the top of both feet 2nd to peripheral nueropathy: Well controlled by gabapentin MUSCLE LENGTH:  POSTURE:  Mild forward head   PALPATION:   LE AROM/PROM:  Mild pain with end range right hip flexion but full motion.  Lumbar flexion:  90 degrees without pain Rotation /side bending both full  LE MMT:  MMT Right 05/22/2021 Left 05/22/2021  Hip flexion 19.6 19.8  Hip extension    Hip abduction 22.6 24.5  Hip adduction    Hip internal rotation    Hip external rotation    Knee flexion    Knee extension 29.8 28.5  Ankle dorsiflexion    Ankle plantarflexion    Ankle inversion    Ankle eversion     (Blank rows = not tested)   GAIT: Mild lateral shift to the right in single leg stance    TODAY'S TREATMENT: Therapeutic exercises: Recumbent bicycle L3 x 5.5 min Bridge x 10, 2 sets Supine resisted clam with green band x 5 bilat, then unilateral x 10 each.  Sidelying R hip abdct x 15 Sidelying R reverse clam x 10, 2 sets (increases discomfort)  Supine R/L piriformis stretch x 20 sec x 2 Supine Rt hamstring stretch x 15 sec    Supine Rt ITB stretch with strap x 15 sec x 2 Seated, modified pigeon pose (piriformis) x 20 sec each side, cues for form  Manual therapy (to decrease fascial restrictions and improve mobility):   STM and TPR to Rt glute med, piriformis, and quadratus femoris  with contract/relax.     PATIENT EDUCATION:  Education details: HEP- verbally added ITB stretch, reviewed symptom management  Person educated: Patient Education method: Explanation, Demonstration, Tactile cues, and Verbal cues Education comprehension: verbalized understanding   HOME EXERCISE PROGRAM: Access Code: [email or web address] URL: [email or web address] Date: 05/22/2021 Prepared by: Carolyne Littles  Exercises Supine Piriformis Stretch with Foot on Ground - 1 x daily - 7 x weekly - 3 sets - 10 reps Standing Glute Med Mobilization with Small Ball on Wall - 1 x daily - 7 x weekly - 3 sets - 1-2 min hold Supine Bridge - 1 x daily - 7 x weekly - 3 sets - 10 reps Hooklying Clamshell with Resistance - 1 x daily - 7 x weekly - 3 sets - 10 reps   ASSESSMENT:  CLINICAL IMPRESSION: Pt reported increased pain in Rt buttocks at end of 5 min on recumbent bicycle; pain resolved with position change.  She reported increased pain in Rt glute/piriformis area with reverse clams; reduced with rest and stretch.  Pt is progressing towards LTGs.  She will be heading out of state for 2 wks on 06/20/21.   Objective impairments include Abnormal gait, decreased activity tolerance, decreased endurance, difficulty walking, decreased ROM, decreased strength, and pain. These impairments are limiting patient from cleaning, driving, yard work, and shopping. Personal factors including 1-2 comorbidities: peripheral neuropathy; depression   are also affecting patient's functional outcome. Patient will benefit from skilled PT to address above impairments and improve overall function.  REHAB POTENTIAL: Excellent  CLINICAL DECISION MAKING: Stable/uncomplicated  EVALUATION COMPLEXITY: Low   GOALS: Goals reviewed with patient? Yes  SHORT TERM GOALS:  STG Name Target Date Goal status  1 Patient will demonstrate full passive right hip flexion  Baseline:  06/27/2021 INITIAL  2 Patient will increase bilateral  hip abduction strength by 5 lbs bilateral  Baseline:  06/27/2021 INITIAL  3 Patient will be independent with a basic HEP  Baseline: 06/27/2021 INITIAL  LONG TERM GOALS:   LTG Name Target Date Goal status  1 Patient will sit for 1 hour without pain in order to go on longer car rides  Baseline: 07/18/2021 INITIAL  2 Patient will ambulate 1 mile without increased pain Baseline: 07/18/2021 INITIAL  3 Patient will be independent with complete exercise  Baseline: 07/18/2021 INITIAL  PLAN: PT FREQUENCY: 1x/week  PT DURATION: 6 weeks  PLANNED INTERVENTIONS: Therapeutic exercises, Therapeutic activity, Neuro Muscular re-education, Gait training, Patient/Family education, Joint mobilization, Dry Needling, Electrical stimulation, Cryotherapy, Moist heat, and Manual therapy  PLAN FOR NEXT SESSION: advance strengthening exercises. Review stretching and  self soft tissue mobilization  Kerin Perna, PTA 06/06/21 4:08 PM

## 2021-06-12 NOTE — Therapy (Incomplete)
OUTPATIENT PHYSICAL THERAPY LOWER EXTREMITY TREATMENT   Patient Name: Paige Patel MRN: [id number] DOB:1950/07/24, 71 y.o., female Today's Date: 06/12/2021     Past Medical History:  Diagnosis Date   Allergy    seasonal   Arthritis    Depression    Frequent headaches    Neuromuscular disorder ([place])    neuropathy   Osteopenia    Osteoporosis    Past Surgical History:  Procedure Laterality Date   COLONOSCOPY  > 10 yrs   MANDIBLE SURGERY     OTHER SURGICAL HISTORY  1998   jaw surgery, balanced it   Patient Active Problem List   Diagnosis Date Noted   Neuropathic pain 12/06/2019   Medicare annual wellness visit, subsequent 05/15/2018   Dyslipidemia 11/12/2017   Osteopenia    Estrogen deficiency 05/07/2017   Depression     PCP: [name], DO  REFERRING PROVIDER: [name]*  REFERRING DIAG: P59.163W (ICD-10-CM) - Tear of right gluteus medius tendon, initial encounter  THERAPY DIAG:  Right Hip Pain   ONSET DATE: Has had pain for 40 years but around Christmas had a significant increase in pain.   SUBJECTIVE:   SUBJECTIVE STATEMENT: Pt reports that sitting continues to bother her Rt hip.  She has adjusted her car seat to tolerate driving.  She is doing exercises 1x/day. She is reporting some reduction of pain so far.   PERTINENT HISTORY:   PAIN:  Are you having pain? yes NPRS scale: 1-2/10  Pain location: Deep in he gluteal  Pain orientation: Right  PAIN TYPE: aching Pain description: intermittent  Aggravating factors: Standing/ walking up hill; lying on it / Sitting  Relieving factors: rest   PRECAUTIONS: None  WEIGHT BEARING RESTRICTIONS No  FALLS:  Has patient fallen in last 6 months? No, Number of falls:   LIVING ENVIRONMENT: Has steps into her home. She can feel it at times. She has a stair lift if needed.   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 Walk her dogs   PLOF: Independent  PATIENT  GOALS   To have less pain/ to ride her bike and kyack/ to not have this pain happen again    OBJECTIVE:   DIAGNOSTIC FINDINGS: X-ray (-)   PATIENT SURVEYS:  FOTO    COGNITION:  Overall cognitive status: Within functional limits for tasks assessed     SENSATION:  Pain in the top of both feet 2nd to peripheral nueropathy: Well controlled by gabapentin MUSCLE LENGTH:  POSTURE:  Mild forward head   PALPATION:   LE AROM/PROM:  Mild pain with end range right hip flexion but full motion.  Lumbar flexion:  90 degrees without pain Rotation /side bending both full  LE MMT:  MMT Right 05/22/2021 Left 05/22/2021  Hip flexion 19.6 19.8  Hip extension    Hip abduction 22.6 24.5  Hip adduction    Hip internal rotation    Hip external rotation    Knee flexion    Knee extension 29.8 28.5  Ankle dorsiflexion    Ankle plantarflexion    Ankle inversion    Ankle eversion     (Blank rows = not tested)   GAIT: Mild lateral shift to the right in single leg stance    TODAY'S TREATMENT: Therapeutic exercises: Recumbent bicycle L3 x 5.5 min Bridge x 10, 2 sets Supine resisted clam with green band x 5 bilat, then unilateral x 10 each.  Sidelying R hip abdct x 15 Sidelying R  reverse clam x 10, 2 sets (increases discomfort)  Supine R/L piriformis stretch x 20 sec x 2 Supine Rt hamstring stretch x 15 sec    Supine Rt ITB stretch with strap x 15 sec x 2 Seated, modified pigeon pose (piriformis) x 20 sec each side, cues for form  Manual therapy (to decrease fascial restrictions and improve mobility):   STM and TPR to Rt glute med, piriformis, and quadratus femoris with contract/relax.     PATIENT EDUCATION:  Education details: HEP- verbally added ITB stretch, reviewed symptom management  Person educated: Patient Education method: Explanation, Demonstration, Tactile cues, and Verbal cues Education comprehension: verbalized understanding   HOME EXERCISE PROGRAM: Access Code:  [email or web address] URL: [email or web address] Date: 05/22/2021 Prepared by: Carolyne Littles  Exercises Supine Piriformis Stretch with Foot on Ground - 1 x daily - 7 x weekly - 3 sets - 10 reps Standing Glute Med Mobilization with Small Ball on Wall - 1 x daily - 7 x weekly - 3 sets - 1-2 min hold Supine Bridge - 1 x daily - 7 x weekly - 3 sets - 10 reps Hooklying Clamshell with Resistance - 1 x daily - 7 x weekly - 3 sets - 10 reps   ASSESSMENT:  CLINICAL IMPRESSION: Pt reported increased pain in Rt buttocks at end of 5 min on recumbent bicycle; pain resolved with position change.  She reported increased pain in Rt glute/piriformis area with reverse clams; reduced with rest and stretch.  Pt is progressing towards LTGs.  She will be heading out of state for 2 wks on 06/20/21.   Objective impairments include Abnormal gait, decreased activity tolerance, decreased endurance, difficulty walking, decreased ROM, decreased strength, and pain. These impairments are limiting patient from cleaning, driving, yard work, and shopping. Personal factors including 1-2 comorbidities: peripheral neuropathy; depression   are also affecting patient's functional outcome. Patient will benefit from skilled PT to address above impairments and improve overall function.  REHAB POTENTIAL: Excellent  CLINICAL DECISION MAKING: Stable/uncomplicated  EVALUATION COMPLEXITY: Low   GOALS: Goals reviewed with patient? Yes  SHORT TERM GOALS:  STG Name Target Date Goal status  1 Patient will demonstrate full passive right hip flexion  Baseline:  07/03/2021 INITIAL  2 Patient will increase bilateral hip abduction strength by 5 lbs bilateral  Baseline:  07/03/2021 INITIAL  3 Patient will be independent with a basic HEP  Baseline: 07/03/2021 INITIAL  LONG TERM GOALS:   LTG Name Target Date Goal status  1 Patient will sit for 1 hour without pain in order to go on longer car rides  Baseline: 07/24/2021 INITIAL  2  Patient will ambulate 1 mile without increased pain Baseline: 07/24/2021 INITIAL  3 Patient will be independent with complete exercise  Baseline: 07/24/2021 INITIAL  PLAN: PT FREQUENCY: 1x/week  PT DURATION: 6 weeks  PLANNED INTERVENTIONS: Therapeutic exercises, Therapeutic activity, Neuro Muscular re-education, Gait training, Patient/Family education, Joint mobilization, Dry Needling, Electrical stimulation, Cryotherapy, Moist heat, and Manual therapy  PLAN FOR NEXT SESSION: advance strengthening exercises. Review stretching and self soft tissue mobilization  Kerin Perna, PTA 06/12/21 10:45 PM

## 2021-06-13 ENCOUNTER — Ambulatory Visit (HOSPITAL_BASED_OUTPATIENT_CLINIC_OR_DEPARTMENT_OTHER): Payer: Medicare Other | Admitting: Physical Therapy

## 2021-06-13 ENCOUNTER — Telehealth (HOSPITAL_BASED_OUTPATIENT_CLINIC_OR_DEPARTMENT_OTHER): Payer: Self-pay | Admitting: Physical Therapy

## 2021-06-13 ENCOUNTER — Encounter (HOSPITAL_BASED_OUTPATIENT_CLINIC_OR_DEPARTMENT_OTHER): Payer: Self-pay

## 2021-06-13 NOTE — Telephone Encounter (Signed)
Patient did not show for PT appointment.  Called patient and she stated she had left a voicemail at front desk.  Patient is sick and didn't want to spread what she has.   ?Encouraged patient to reschedule when she is feeling well and after she has returned from her trip. ? ?Kerin Perna, PTA ?06/13/21 2:06 PM ? ?

## 2021-06-15 ENCOUNTER — Other Ambulatory Visit: Payer: Self-pay

## 2021-06-15 ENCOUNTER — Ambulatory Visit (INDEPENDENT_AMBULATORY_CARE_PROVIDER_SITE_OTHER): Payer: Medicare Other | Admitting: Orthopaedic Surgery

## 2021-06-15 DIAGNOSIS — S76011A Strain of muscle, fascia and tendon of right hip, initial encounter: Secondary | ICD-10-CM | POA: Diagnosis not present

## 2021-06-15 NOTE — Progress Notes (Signed)
? ?                            ? ? ?Chief Complaint: right hip pain ?  ? ? ?History of Present Illness:  ? ?06/15/2021: Presents today with nearly complete relief on the right hip after injection.  She is working on strengthening of the hip and was recently able to remodel entire room with minimal pain.  She is overall very happy with the quality of her hip at this time very strong with abduction of the right hip ? ? ?Paige Patel is a 71 y.o. female with right hip pain now going on for several years.  This has been worse over the last several years.  She states that on Christmas Eve she can barely stand.  She is having a difficult time with standing up on this right side.  Denies any clicking or popping.  Denies any groin pain.  She has difficult time laying directly on that side.  She has a difficult time with stairs.  She is taking ibuprofen which helps somewhat ? ? ? ?Surgical History:   ?None ? ?PMH/PSH/Family History/Social History/Meds/Allergies:   ? ?Past Medical History:  ?Diagnosis Date  ? Allergy   ? seasonal  ? Arthritis   ? Depression   ? Frequent headaches   ? Neuromuscular disorder ([place])   ? neuropathy  ? Osteopenia   ? Osteoporosis   ? ?Past Surgical History:  ?Procedure Laterality Date  ? COLONOSCOPY  > 10 yrs  ? MANDIBLE SURGERY    ? OTHER SURGICAL HISTORY  1998  ? jaw surgery, balanced it  ? ?Social History  ? ?Socioeconomic History  ? Marital status: Widowed  ?  Spouse name: Not on file  ? Number of children: Not on file  ? Years of education: Not on file  ? Highest education level: Not on file  ?Occupational History  ? Not on file  ?Tobacco Use  ? Smoking status: Never  ? Smokeless tobacco: Never  ?Vaping Use  ? Vaping Use: Never used  ?Substance and Sexual Activity  ? Alcohol use: Yes  ?  Comment: seldom  ? Drug use: No  ? Sexual activity: Not on file  ?Other Topics Concern  ? Not on file  ?Social History Narrative  ? Not on file  ? ?Social Determinants of Health  ? ?Financial Resource Strain:  Low Risk   ? Difficulty of Paying Living Expenses: Not hard at all  ?Food Insecurity: No Food Insecurity  ? Worried About Charity fundraiser in the Last Year: Never true  ? Ran Out of Food in the Last Year: Never true  ?Transportation Needs: No Transportation Needs  ? Lack of Transportation (Medical): No  ? Lack of Transportation (Non-Medical): No  ?Physical Activity: Sufficiently Active  ? Days of Exercise per Week: 7 days  ? Minutes of Exercise per Session: 30 min  ?Stress: No Stress Concern Present  ? Feeling of Stress : Not at all  ?Social Connections: Socially Integrated  ? Frequency of Communication with Friends and Family: More than three times a week  ? Frequency of Social Gatherings with Friends and Family: More than three times a week  ? Attends Religious Services: 1 to 4 times per year  ? Active Member of Clubs or Organizations: Yes  ? Attends Archivist Meetings: 1 to 4 times per year  ? Marital Status: Married  ? ?Family History  ?  Problem Relation Age of Onset  ? Alzheimer's disease Mother 69  ? High Cholesterol Mother   ? Cancer Father   ?     Lung  ? High Cholesterol Father   ? Hypertension Brother   ? Breast cancer Paternal Aunt   ? Colon cancer Neg Hx   ? Colon polyps Neg Hx   ? Esophageal cancer Neg Hx   ? Rectal cancer Neg Hx   ? Stomach cancer Neg Hx   ? ?No Known Allergies ?Current Outpatient Medications  ?Medication Sig Dispense Refill  ? fluticasone (FLONASE) 50 MCG/ACT nasal spray Place 2 sprays into both nostrils daily. 48 g 2  ? gabapentin (NEURONTIN) 100 MG capsule TAKE 1 CAPSULE IN THE MORNING AND 2 CAPSULES IN THE EVENING 270 capsule 1  ? gabapentin (NEURONTIN) 300 MG capsule Take 1 capsule (300 mg total) by mouth daily. Along with the 100 mg nightly dosing. 90 capsule 2  ? methocarbamol (ROBAXIN) 500 MG tablet TAKE 1 TABLET AS NEEDED FOR MUSCLE SPASMS AS DIRECTED 90 tablet 3  ? Olopatadine HCl 0.2 % SOLN Apply 1 drop per eye twice daily. 7.5 mL 0  ? omeprazole (PRILOSEC) 10  MG capsule Take 10 mg by mouth daily.    ? rosuvastatin (CRESTOR) 20 MG tablet Take 1 tablet (20 mg total) by mouth daily. 90 tablet 3  ? venlafaxine XR (EFFEXOR-XR) 150 MG 24 hr capsule TAKE 1 CAPSULE DAILY WITH BREAKFAST 90 capsule 3  ? ?No current facility-administered medications for this visit.  ? ?No results found. ? ?Review of Systems:   ?A ROS was performed including pertinent positives and negatives as documented in the HPI. ? ?Physical Exam :   ?Constitutional: NAD and appears stated age ?Neurological: Alert and oriented ?Psych: Appropriate affect and cooperative ?There were no vitals taken for this visit.  ? ?Comprehensive Musculoskeletal Exam:   ? ?Inspection Right Left  ?Skin No atrophy or gross abnormalities appreciated No atrophy or gross abnormalities appreciated  ?Palpation    ?Tenderness None None  ?Crepitus None None  ?Range of Motion    ?Flexion (passive) 120 120  ?Extension 30 30  ?IR 30 30  ?ER 45 45  ?Strength    ?Flexion  5/5 5/5  ?Extension 5/5 5/5  ?Special Tests    ?FABIR Negative Negative  ?FADER Negative Negative  ?ER Lag/Capsular Insufficiency Negative Negative  ?Instability Negative Negative  ?Sacroiliac pain Negative  Negative   ?Instability    ?Generalized Laxity No No  ?Neurologic    ?sciatic, femoral, obturator nerves intact to light sensation  ?Vascular/Lymphatic    ?DP pulse 2+ 2+  ?Lumbar Exam    ?Patient has symmetric lumbar range of motion with negative pain referral to hip  ?Positive weakness with resisted abduction, weakness with standing leg squat on the side ? ? ?Imaging:   ?Xray (3 views AP pelvis 2 views right hip): ?Normal ? ? ?I personally reviewed and interpreted the radiographs. ? ? ?Assessment:   ?71 year old female with right likely gluteus medius tendinitis versus tendinosis versus tearing.  Given the fact that this did respond so well to injection and strengthening I do believe that this was likely more just some mild tendinitis.  At this time she is doing  extremely well.  She will continue to work on a home exercise program and she will return to clinic as needed for additional injection should her pain recur. ? ?Plan :   ? ?-Return to clinic as needed ? ? ? ? ? ?  I personally saw and evaluated the patient, and participated in the management and treatment plan. ? ?Vanetta Mulders, MD ?Attending Physician, Orthopedic Surgery ? ?This document was dictated using Systems analyst. A reasonable attempt at proof reading has been made to minimize errors. ?

## 2021-07-24 ENCOUNTER — Encounter (HOSPITAL_BASED_OUTPATIENT_CLINIC_OR_DEPARTMENT_OTHER): Payer: Self-pay | Admitting: Physical Therapy

## 2021-09-04 ENCOUNTER — Encounter: Payer: Self-pay | Admitting: Family Medicine

## 2021-09-04 DIAGNOSIS — F339 Major depressive disorder, recurrent, unspecified: Secondary | ICD-10-CM

## 2021-09-04 MED ORDER — VENLAFAXINE HCL ER 150 MG PO CP24: ORAL_CAPSULE | ORAL | 0 refills | Status: DC

## 2021-09-16 ENCOUNTER — Other Ambulatory Visit: Payer: Self-pay | Admitting: Family Medicine

## 2021-09-16 DIAGNOSIS — F339 Major depressive disorder, recurrent, unspecified: Secondary | ICD-10-CM

## 2021-09-19 ENCOUNTER — Other Ambulatory Visit: Payer: Self-pay | Admitting: Family Medicine

## 2021-09-19 MED ORDER — ROSUVASTATIN CALCIUM 20 MG PO TABS: 20.0000 mg | ORAL_TABLET | Freq: Every day | ORAL | 3 refills | Status: DC

## 2021-09-24 ENCOUNTER — Ambulatory Visit (INDEPENDENT_AMBULATORY_CARE_PROVIDER_SITE_OTHER): Payer: Medicare Other | Admitting: Family Medicine

## 2021-09-24 ENCOUNTER — Encounter: Payer: Self-pay | Admitting: Family Medicine

## 2021-09-24 VITALS — BP 120/82 | HR 71 | Temp 98.0°F | Ht 68.0 in | Wt 165.4 lb

## 2021-09-24 DIAGNOSIS — E785 Hyperlipidemia, unspecified: Secondary | ICD-10-CM | POA: Diagnosis not present

## 2021-09-24 DIAGNOSIS — F339 Major depressive disorder, recurrent, unspecified: Secondary | ICD-10-CM

## 2021-09-24 DIAGNOSIS — Z1231 Encounter for screening mammogram for malignant neoplasm of breast: Secondary | ICD-10-CM | POA: Diagnosis not present

## 2021-09-24 NOTE — Patient Instructions (Addendum)
Keep the diet clean and stay active.  For the muscle cramping, drink lots of fluids. Also take a spoonful of pickle juice nightly. An alternative would be a teaspoon of mustard, but most people prefer pickle juice. You could also take 200 mg of magnesium daily if that doesn't work.   Someone should reach out in the next week or so regarding your mammogram.   Let us know if you need anything.

## 2021-09-24 NOTE — Progress Notes (Signed)
Chief Complaint  Patient presents with   Follow-up    6 month     Subjective Paige Patel presents for f/u depression.  Pt is currently being treated with Effexor XR 150 mg/d.  Reports doing well, reports compliance, no AE's. No thoughts of harming self or others. No self-medication with alcohol, prescription drugs or illicit drugs. Pt is not following with a counselor/psychologist.  Dyslipidemia Patient presents for dyslipidemia follow up. Currently being treated with Crestor 20 mg/d and compliance with treatment thus far has been good. She denies myalgias. She is adhering to a healthy diet. Exercise: walking, active in yard/volunteer work The patient is not known to have coexisting coronary artery disease.  Past Medical History:  Diagnosis Date   Allergy    seasonal   Arthritis    Depression    Frequent headaches    Neuromuscular disorder ([place])    neuropathy   Osteopenia    Osteoporosis    Allergies as of 09/24/2021   No Known Allergies      Medication List        Accurate as of September 24, 2021  8:55 AM. If you have any questions, ask your nurse or doctor.          STOP taking these medications    methocarbamol 500 MG tablet Commonly known as: ROBAXIN       TAKE these medications    fluticasone 50 MCG/ACT nasal spray Commonly known as: FLONASE Place 2 sprays into both nostrils daily.   gabapentin 100 MG capsule Commonly known as: NEURONTIN TAKE 1 CAPSULE IN THE MORNING AND 2 CAPSULES IN THE EVENING   gabapentin 300 MG capsule Commonly known as: NEURONTIN Take 1 capsule (300 mg total) by mouth daily. Along with the 100 mg nightly dosing.   Olopatadine HCl 0.2 % Soln Apply 1 drop per eye twice daily.   omeprazole 10 MG capsule Commonly known as: PRILOSEC Take 10 mg by mouth daily.   rosuvastatin 20 MG tablet Commonly known as: CRESTOR Take 1 tablet (20 mg total) by mouth daily.   venlafaxine XR 150 MG 24 hr capsule Commonly known as:  EFFEXOR-XR TAKE 1 CAPSULE BY MOUTH DAILY  WITH BREAKFAST        Exam BP 120/82   Pulse 71   Temp 98 F (36.7 C) (Oral)   Ht '5\' 8"'$  (1.727 m)   Wt 165 lb 6 oz (75 kg)   SpO2 99%   BMI 25.15 kg/m  General:  well developed, well nourished, in no apparent distress Heart: RRR, No LE edema Lungs:  CTAB. No respiratory distress Psych: well oriented with normal range of affect and age-appropriate judgement/insight, alert and oriented x4.  Assessment and Plan  Depression, recurrent ([place])  Dyslipidemia  Chronic, stable. Cont Effexor XR 150 mg/d.  Chronic, stable. Cont Crestor 20 mg/d. Counseled on diet/exercise. Advanced directive form requested today.  Mammogram ordered to be done at GI.  F/u in 6 mo. The patient voiced understanding and agreement to the plan.  [place], DO 09/24/21 8:55 AM

## 2021-10-08 NOTE — Progress Notes (Deleted)
Subjective:   Paige Patel is a 71 y.o. female who presents for Medicare Annual (Subsequent) preventive examination.  I connected with Paige Patel today by telephone and verified that I am speaking with the correct person using two identifiers. Location patient: home Location provider: work Persons participating in the virtual visit: patient, Paige Patel.    I discussed the limitations, risks, security and privacy concerns of performing an evaluation and management service by telephone and the availability of in person appointments. I also discussed with the patient that there may be a patient responsible charge related to this service. The patient expressed understanding and verbally consented to this telephonic visit.    Interactive audio and video telecommunications were attempted between this provider and patient, however failed, due to patient having technical difficulties OR patient did not have access to video capability.  We continued and completed visit with audio only.  Some vital signs may be absent or patient reported.   Time Spent with patient on telephone encounter: *** minutes   Review of Systems    ***       Objective:    There were no vitals filed for this visit. There is no height or weight on file to calculate BMI.     05/22/2021    2:00 PM 09/19/2020    1:01 PM 05/17/2019    8:11 AM  Advanced Directives  Does Patient Have a Medical Advance Directive? Yes Yes No  Type of Paramedic of [place];Living will [place];Living will   Copy of [place] in Chart? No - copy requested No - copy requested   Would patient like information on creating a medical advance directive?   No - Patient declined    Current Medications (verified) Outpatient Encounter Medications as of 10/08/2021  Medication Sig   fluticasone (FLONASE) 50 MCG/ACT nasal spray Place 2 sprays into both nostrils daily.   gabapentin (NEURONTIN) 100 MG  capsule TAKE 1 CAPSULE IN THE MORNING AND 2 CAPSULES IN THE EVENING   gabapentin (NEURONTIN) 300 MG capsule Take 1 capsule (300 mg total) by mouth daily. Along with the 100 mg nightly dosing.   Olopatadine HCl 0.2 % SOLN Apply 1 drop per eye twice daily.   omeprazole (PRILOSEC) 10 MG capsule Take 10 mg by mouth daily.   rosuvastatin (CRESTOR) 20 MG tablet Take 1 tablet (20 mg total) by mouth daily.   venlafaxine XR (EFFEXOR-XR) 150 MG 24 hr capsule TAKE 1 CAPSULE BY MOUTH DAILY  WITH BREAKFAST   No facility-administered encounter medications on file as of 10/08/2021.    Allergies (verified) Patient has no known allergies.   History: Past Medical History:  Diagnosis Date   Allergy    seasonal   Arthritis    Depression    Frequent headaches    Neuromuscular disorder (HCC)    neuropathy   Osteopenia    Osteoporosis    Past Surgical History:  Procedure Laterality Date   COLONOSCOPY  > 10 yrs   MANDIBLE SURGERY     OTHER SURGICAL HISTORY  1998   jaw surgery, balanced it   Family History  Problem Relation Age of Onset   Alzheimer's disease Mother 17   High Cholesterol Mother    Cancer Father        Lung   High Cholesterol Father    Hypertension Brother    Breast cancer Paternal Aunt    Colon cancer Neg Hx    Colon polyps Neg Hx  Esophageal cancer Neg Hx    Rectal cancer Neg Hx    Stomach cancer Neg Hx    Social History   Socioeconomic History   Marital status: Widowed    Spouse name: Not on file   Number of children: Not on file   Years of education: Not on file   Highest education level: Not on file  Occupational History   Not on file  Tobacco Use   Smoking status: Never   Smokeless tobacco: Never  Vaping Use   Vaping Use: Never used  Substance and Sexual Activity   Alcohol use: Yes    Comment: seldom   Drug use: No   Sexual activity: Not on file  Other Topics Concern   Not on file  Social History Narrative   Not on file   Social Determinants of  Health   Financial Resource Strain: Low Risk  (09/19/2020)   Overall Financial Resource Strain (CARDIA)    Difficulty of Paying Living Expenses: Not hard at all  Food Insecurity: No Food Insecurity (09/19/2020)   Hunger Vital Sign    Worried About Running Out of Food in the Last Year: Never true    [place] in the Last Year: Never true  Transportation Needs: No Transportation Needs (09/19/2020)   PRAPARE - Hydrologist (Medical): No    Lack of Transportation (Non-Medical): No  Physical Activity: Sufficiently Active (09/19/2020)   Exercise Vital Sign    Days of Exercise per Week: 7 days    Minutes of Exercise per Session: 30 min  Stress: No Stress Concern Present (09/19/2020)   [place]    Feeling of Stress : Not at all  Social Connections: [place] (09/19/2020)   Social Connection and Isolation Panel [NHANES]    Frequency of Communication with Friends and Family: More than three times a week    Frequency of Social Gatherings with Friends and Family: More than three times a week    Attends Religious Services: 1 to 4 times per year    Active Member of [employer] or Organizations: Yes    Attends Archivist Meetings: 1 to 4 times per year    Marital Status: Married    Tobacco Counseling Counseling given: Not Answered   Clinical Intake:              How often do you need to have someone help you when you read instructions, pamphlets, or other written materials from your doctor or pharmacy?: (P) 1 - Never  Diabetic?No         Activities of Daily Living    10/07/2021    7:29 AM 10/24/2020    9:18 AM  In your present state of health, do you have any difficulty performing the following activities:  Hearing? 0 0  Vision? 0 0  Difficulty concentrating or making decisions? 0 0  Walking or climbing stairs? 0 0  Dressing or bathing? 0 0  Doing errands, shopping? 0  0  Preparing Food and eating ? N   Using the Toilet? N   In the past six months, have you accidently leaked urine? N   Do you have problems with loss of bowel control? N   Managing your Medications? N   Managing your Finances? N   Housekeeping or managing your Housekeeping? N     Patient Care Team: Paige Pal, DO as PCP - General (Family Medicine)  Indicate any recent Medical Services you may have received from other than [facility] providers in the past year (date may be approximate).     Assessment:   This is a routine wellness examination for [facility].  Hearing/Vision screen No results found.  Dietary issues and exercise activities discussed:     Goals Addressed   None    Depression Screen    09/24/2021    8:50 AM 04/27/2021    7:26 AM 09/19/2020    1:02 PM 05/17/2019    8:16 AM  PHQ 2/9 Scores  PHQ - 2 Score 0 1 0 0  PHQ- 9 Score 0       Fall Risk    10/07/2021    7:29 AM 09/24/2021    8:50 AM 04/27/2021    7:26 AM 09/19/2020    1:02 PM 05/17/2019    8:16 AM  Fall Risk   Falls in the past year? 0 0 0 0 0  Number falls in past yr: 0 0 0 0 0  Injury with Fall? 0 0 0 0 0  Risk for fall due to :  No Fall Risks No Fall Risks    Follow up  Falls evaluation completed Falls evaluation completed Falls prevention discussed Education provided;Falls prevention discussed    FALL RISK PREVENTION PERTAINING TO THE HOME:  Any stairs in or around the home? {YES/NO:21197} If so, are there any without handrails? {YES/NO:21197} Home free of loose throw rugs in walkways, pet beds, electrical cords, etc? {YES/NO:21197} Adequate lighting in your home to reduce risk of falls? {YES/NO:21197}  ASSISTIVE DEVICES UTILIZED TO PREVENT FALLS:  Life alert? {YES/NO:21197} Use of a cane, walker or w/c? {YES/NO:21197} Grab bars in the bathroom? {YES/NO:21197} Shower chair or bench in shower? {YES/NO:21197} Elevated toilet seat or a handicapped toilet? {YES/NO:21197}  TIMED UP AND  GO:  Was the test performed? No . Phone visit   Cognitive Function:Normal cognitive status assessed by this Nurse Health Advisor. No abnormalities found.          Immunizations Immunization History  Administered Date(s) Administered   Fluad Quad(high Dose 65+) 01/27/2019   Influenza, High Dose Seasonal PF 02/17/2018, 02/04/2020   Influenza-Unspecified 01/13/2021   PFIZER(Purple Top)SARS-COV-2 Vaccination 05/21/2019, 06/11/2019, 02/04/2020, 08/01/2020   Pfizer Covid-19 Vaccine Bivalent Booster 19yr & up 01/13/2021   Pneumococcal Conjugate-13 03/20/2017, 11/12/2017   Pneumococcal Polysaccharide-23 12/06/2019   Td 05/07/2017   Zoster Recombinat (Shingrix) 11/12/2017, 02/27/2018    TDAP status: Up to date  Flu Vaccine status: Up to date  Pneumococcal vaccine status: Up to date  Covid-19 vaccine status: Completed vaccines  Qualifies for Shingles Vaccine? No   Zostavax completed No   Shingrix Completed?: Yes  Screening Tests Health Maintenance  Topic Date Due   MAMMOGRAM  08/02/2021   COVID-19 Vaccine (6 - Pfizer series) 10/10/2021 (Originally 05/16/2021)   INFLUENZA VACCINE  11/13/2021   COLONOSCOPY (Pts 45-484yrInsurance coverage will need to be confirmed)  04/12/2025   TETANUS/TDAP  05/08/2027   Pneumonia Vaccine 6570Years old  Completed   DEXA SCAN  Completed   Hepatitis C Screening  Completed   Zoster Vaccines- Shingrix  Completed   HPV VACCINES  Aged Out    Health Maintenance  Health Maintenance Due  Topic Date Due   MAMMOGRAM  08/02/2021    Colorectal cancer screening: Type of screening: Colonoscopy. Completed 04/12/2020. Repeat every 5 years  Mammogram status: Scheduled for 10/18/2021  {Bone Density status:21018021}  Lung Cancer Screening: (Low Dose CT Chest  recommended if Age 44-80 years, 42 pack-year currently smoking OR have quit w/in 15years.) does not qualify.     Additional Screening:  Hepatitis C Screening: Completed 05/07/2017  Vision  Screening: Recommended annual ophthalmology exams for early detection of glaucoma and other disorders of the eye. Is the patient up to date with their annual eye exam?  {YES/NO:21197} Who is the provider or what is the name of the office in which the patient attends annual eye exams? *** If pt is not established with a provider, would they like to be referred to a provider to establish care? {YES/NO:21197}.   Dental Screening: Recommended annual dental exams for proper oral hygiene  Community Resource Referral / Chronic Care Management: CRR required this visit?  {YES/NO:21197}  CCM required this visit?  {YES/NO:21197}     Plan:     I have personally reviewed and noted the following in the patient's chart:   Medical and social history Use of alcohol, tobacco or illicit drugs  Current medications and supplements including opioid prescriptions.  Functional ability and status Nutritional status Physical activity Advanced directives List of other physicians Hospitalizations, surgeries, and ER visits in previous 12 months Vitals Screenings to include cognitive, depression, and falls Referrals and appointments  In addition, I have reviewed and discussed with patient certain preventive protocols, quality metrics, and best practice recommendations. A written personalized care plan for preventive services as well as general preventive health recommendations were provided to patient.   Due to this being a telephonic visit, the after visit summary with patients personalized plan was offered to patient via mail or my-chart. Patient would like to access on my-chart.   Marta Antu, LPN   [id number]  Nurse Health Advisor  Nurse Notes: None

## 2021-10-15 ENCOUNTER — Ambulatory Visit (INDEPENDENT_AMBULATORY_CARE_PROVIDER_SITE_OTHER): Payer: Medicare Other

## 2021-10-15 VITALS — Ht 68.0 in | Wt 165.0 lb

## 2021-10-15 DIAGNOSIS — Z0000 Encounter for general adult medical examination without abnormal findings: Secondary | ICD-10-CM | POA: Diagnosis not present

## 2021-10-15 NOTE — Patient Instructions (Signed)
[name] , Thank you for taking time to complete your Medicare Wellness Visit. I appreciate your ongoing commitment to your health goals. Please review the following plan we discussed and let me know if I can assist you in the future.   Screening recommendations/referrals: Colonoscopy: Completed 04/12/2020-Due 04/12/2025 Mammogram: Scheduled for 10/18/2021 Bone Density: Due-Declined today Recommended yearly ophthalmology/optometry visit for glaucoma screening and checkup Recommended yearly dental visit for hygiene and checkup  Vaccinations: Influenza vaccine: Up to date Pneumococcal vaccine: Up to date Tdap vaccine: Up to date Shingles vaccine: Completed vaccines   Covid-19:Up to date  Advanced directives: Please bring a copy of Living Will and/or Healthcare Power of Attorney for your chart.   Conditions/risks identified: See problem list  Next appointment: Follow up in one year for your annual wellness visit    Preventive Care 65 Years and Older, Female Preventive care refers to lifestyle choices and visits with your health care provider that can promote health and wellness. What does preventive care include? A yearly physical exam. This is also called an annual well check. Dental exams once or twice a year. Routine eye exams. Ask your health care provider how often you should have your eyes checked. Personal lifestyle choices, including: Daily care of your teeth and gums. Regular physical activity. Eating a healthy diet. Avoiding tobacco and drug use. Limiting alcohol use. Practicing safe sex. Taking low-dose aspirin every day. Taking vitamin and mineral supplements as recommended by your health care provider. What happens during an annual well check? The services and screenings done by your health care provider during your annual well check will depend on your age, overall health, lifestyle risk factors, and family history of disease. Counseling  Your health care  provider may ask you questions about your: Alcohol use. Tobacco use. Drug use. Emotional well-being. Home and relationship well-being. Sexual activity. Eating habits. History of falls. Memory and ability to understand (cognition). Work and work Statistician. Reproductive health. Screening  You may have the following tests or measurements: Height, weight, and BMI. Blood pressure. Lipid and cholesterol levels. These may be checked every 5 years, or more frequently if you are over 25 years old. Skin check. Lung cancer screening. You may have this screening every year starting at age 39 if you have a 30-pack-year history of smoking and currently smoke or have quit within the past 15 years. Fecal occult blood test (FOBT) of the stool. You may have this test every year starting at age 66. Flexible sigmoidoscopy or colonoscopy. You may have a sigmoidoscopy every 5 years or a colonoscopy every 10 years starting at age 108. Hepatitis C blood test. Hepatitis B blood test. Sexually transmitted disease (STD) testing. Diabetes screening. This is done by checking your blood sugar (glucose) after you have not eaten for a while (fasting). You may have this done every 1-3 years. Bone density scan. This is done to screen for osteoporosis. You may have this done starting at age 26. Mammogram. This may be done every 1-2 years. Talk to your health care provider about how often you should have regular mammograms. Talk with your health care provider about your test results, treatment options, and if necessary, the need for more tests. Vaccines  Your health care provider may recommend certain vaccines, such as: Influenza vaccine. This is recommended every year. Tetanus, diphtheria, and acellular pertussis (Tdap, Td) vaccine. You may need a Td booster every 10 years. Zoster vaccine. You may need this after age 97. Pneumococcal 13-valent conjugate (PCV13) vaccine.  One dose is recommended after age  54. Pneumococcal polysaccharide (PPSV23) vaccine. One dose is recommended after age 80. Talk to your health care provider about which screenings and vaccines you need and how often you need them. This information is not intended to replace advice given to you by your health care provider. Make sure you discuss any questions you have with your health care provider. Document Released: 04/28/2015 Document Revised: 12/20/2015 Document Reviewed: 01/31/2015 Elsevier Interactive Patient Education  2017 [place] Prevention in the Home Falls can cause injuries. They can happen to people of all ages. There are many things you can do to make your home safe and to help prevent falls. What can I do on the outside of my home? Regularly fix the edges of walkways and driveways and fix any cracks. Remove anything that might make you trip as you walk through a door, such as a raised step or threshold. Trim any bushes or trees on the path to your home. Use bright outdoor lighting. Clear any walking paths of anything that might make someone trip, such as rocks or tools. Regularly check to see if handrails are loose or broken. Make sure that both sides of any steps have handrails. Any raised decks and porches should have guardrails on the edges. Have any leaves, snow, or ice cleared regularly. Use sand or salt on walking paths during winter. Clean up any spills in your garage right away. This includes oil or grease spills. What can I do in the bathroom? Use night lights. Install grab bars by the toilet and in the tub and shower. Do not use towel bars as grab bars. Use non-skid mats or decals in the tub or shower. If you need to sit down in the shower, use a plastic, non-slip stool. Keep the floor dry. Clean up any water that spills on the floor as soon as it happens. Remove soap buildup in the tub or shower regularly. Attach bath mats securely with double-sided non-slip rug tape. Do not have throw  rugs and other things on the floor that can make you trip. What can I do in the bedroom? Use night lights. Make sure that you have a light by your bed that is easy to reach. Do not use any sheets or blankets that are too big for your bed. They should not hang down onto the floor. Have a firm chair that has side arms. You can use this for support while you get dressed. Do not have throw rugs and other things on the floor that can make you trip. What can I do in the kitchen? Clean up any spills right away. Avoid walking on wet floors. Keep items that you use a lot in easy-to-reach places. If you need to reach something above you, use a strong step stool that has a grab bar. Keep electrical cords out of the way. Do not use floor polish or wax that makes floors slippery. If you must use wax, use non-skid floor wax. Do not have throw rugs and other things on the floor that can make you trip. What can I do with my stairs? Do not leave any items on the stairs. Make sure that there are handrails on both sides of the stairs and use them. Fix handrails that are broken or loose. Make sure that handrails are as long as the stairways. Check any carpeting to make sure that it is firmly attached to the stairs. Fix any carpet that is loose or  worn. Avoid having throw rugs at the top or bottom of the stairs. If you do have throw rugs, attach them to the floor with carpet tape. Make sure that you have a light switch at the top of the stairs and the bottom of the stairs. If you do not have them, ask someone to add them for you. What else can I do to help prevent falls? Wear shoes that: Do not have high heels. Have rubber bottoms. Are comfortable and fit you well. Are closed at the toe. Do not wear sandals. If you use a stepladder: Make sure that it is fully opened. Do not climb a closed stepladder. Make sure that both sides of the stepladder are locked into place. Ask someone to hold it for you, if  possible. Clearly mark and make sure that you can see: Any grab bars or handrails. First and last steps. Where the edge of each step is. Use tools that help you move around (mobility aids) if they are needed. These include: Canes. Walkers. Scooters. Crutches. Turn on the lights when you go into a dark area. Replace any light bulbs as soon as they burn out. Set up your furniture so you have a clear path. Avoid moving your furniture around. If any of your floors are uneven, fix them. If there are any pets around you, be aware of where they are. Review your medicines with your doctor. Some medicines can make you feel dizzy. This can increase your chance of falling. Ask your doctor what other things that you can do to help prevent falls. This information is not intended to replace advice given to you by your health care provider. Make sure you discuss any questions you have with your health care provider. Document Released: 01/26/2009 Document Revised: 09/07/2015 Document Reviewed: 05/06/2014 Elsevier Interactive Patient Education  2017 [employer].

## 2021-10-15 NOTE — Progress Notes (Signed)
Subjective:   Paige Patel is a 71 y.o. female who presents for Medicare Annual (Subsequent) preventive examination.  I connected with Thia today by telephone and verified that I am speaking with the correct person using two identifiers. Location patient: home Location provider: work Persons participating in the virtual visit: patient, [name].    I discussed the limitations, risks, security and privacy concerns of performing an evaluation and management service by telephone and the availability of in person appointments. I also discussed with the patient that there may be a patient responsible charge related to this service. The patient expressed understanding and verbally consented to this telephonic visit.    Interactive audio and video telecommunications were attempted between this provider and patient, however failed, due to patient having technical difficulties OR patient did not have access to video capability.  We continued and completed visit with audio only.  Some vital signs may be absent or patient reported.   Time Spent with patient on telephone encounter: 20 minutes   Review of Systems     Cardiac Risk Factors include: advanced age (>30mn, >>64women);dyslipidemia     Objective:    Today's Vitals   10/15/21 1259  Weight: 165 lb (74.8 kg)  Height: '5\' 8"'$  (1.727 m)   Body mass index is 25.09 kg/m.     10/15/2021    1:01 PM 05/22/2021    2:00 PM 09/19/2020    1:01 PM 05/17/2019    8:11 AM  Advanced Directives  Does Patient Have a Medical Advance Directive? Yes Yes Yes No  Type of AParamed[place]of [place]Living will [place] will [place] will   Copy of [place] Chart? No - copy requested No - copy requested No - copy requested   Would patient like information on creating a medical advance directive?    No - Patient declined    Current Medications (verified) Outpatient  Encounter Medications as of 10/15/2021  Medication Sig   fluticasone (FLONASE) 50 MCG/ACT nasal spray Place 2 sprays into both nostrils daily.   gabapentin (NEURONTIN) 100 MG capsule TAKE 1 CAPSULE IN THE MORNING AND 2 CAPSULES IN THE EVENING   gabapentin (NEURONTIN) 300 MG capsule Take 1 capsule (300 mg total) by mouth daily. Along with the 100 mg nightly dosing.   Olopatadine HCl 0.2 % SOLN Apply 1 drop per eye twice daily.   omeprazole (PRILOSEC) 10 MG capsule Take 10 mg by mouth daily.   rosuvastatin (CRESTOR) 20 MG tablet Take 1 tablet (20 mg total) by mouth daily.   venlafaxine XR (EFFEXOR-XR) 150 MG 24 hr capsule TAKE 1 CAPSULE BY MOUTH DAILY  WITH BREAKFAST   No facility-administered encounter medications on file as of 10/15/2021.    Allergies (verified) Patient has no known allergies.   History: Past Medical History:  Diagnosis Date   Allergy    seasonal   Arthritis    Depression    Frequent headaches    Neuromuscular disorder (HCC)    neuropathy   Osteopenia    Osteoporosis    Past Surgical History:  Procedure Laterality Date   COLONOSCOPY  > 10 yrs   MANDIBLE SURGERY     OTHER SURGICAL HISTORY  1998   jaw surgery, balanced it   Family History  Problem Relation Age of Onset   Alzheimer's disease Mother 820  High Cholesterol Mother    Cancer Father        Lung  High Cholesterol Father    Hypertension Brother    Breast cancer Paternal Aunt    Colon cancer Neg Hx    Colon polyps Neg Hx    Esophageal cancer Neg Hx    Rectal cancer Neg Hx    Stomach cancer Neg Hx    Social History   Socioeconomic History   Marital status: Widowed    Spouse name: Not on file   Number of children: Not on file   Years of education: Not on file   Highest education level: Not on file  Occupational History   Not on file  Tobacco Use   Smoking status: Never   Smokeless tobacco: Never  Vaping Use   Vaping Use: Never used  Substance and Sexual Activity   Alcohol use: Yes     Comment: seldom   Drug use: No   Sexual activity: Not on file  Other Topics Concern   Not on file  Social History Narrative   Not on file   Social Determinants of Health   Financial Resource Strain: Low Risk  (10/15/2021)   Overall Financial Resource Strain (CARDIA)    Difficulty of Paying Living Expenses: Not hard at all  Food Insecurity: No Food Insecurity (10/15/2021)   Hunger Vital Sign    Worried About Running Out of Food in the Last Year: Never true    [place] in the Last Year: Never true  Transportation Needs: No Transportation Needs (10/15/2021)   PRAPARE - Hydrologist (Medical): No    Lack of Transportation (Non-Medical): No  Physical Activity: Sufficiently Active (10/15/2021)   Exercise Vital Sign    Days of Exercise per Week: 7 days    Minutes of Exercise per Session: 30 min  Stress: No Stress Concern Present (10/15/2021)   [place]    Feeling of Stress : Not at all  Social Connections: Moderately Isolated (10/15/2021)   Social Connection and Isolation Panel [NHANES]    Frequency of Communication with Friends and Family: Once a week    Frequency of Social Gatherings with Friends and Family: More than three times a week    Attends Religious Services: Never    [name] or Organizations: Yes    Attends Music therapist: More than 4 times per year    Marital Status: Divorced    Tobacco Counseling Counseling given: Not Answered   Clinical Intake:  Pre-visit preparation completed: Yes  Pain : No/denies pain     BMI - recorded: 25.09 Nutritional Status: BMI 25 -29 Overweight Nutritional Risks: None Diabetes: No  How often do you need to have someone help you when you read instructions, pamphlets, or other written materials from your doctor or pharmacy?: 1 - Never  Diabetic?No  Interpreter Needed?: No  Information entered by :: Caroleen Hamman LPN   Activities of Daily Living    10/15/2021    1:05 PM 10/13/2021    8:09 AM  In your present state of health, do you have any difficulty performing the following activities:  Hearing? 0 0  Vision? 0 0  Difficulty concentrating or making decisions? 0 0  Walking or climbing stairs? 0 0  Dressing or bathing? 0 0  Doing errands, shopping? 0 0  Preparing Food and eating ? N N  Using the Toilet? N N  In the past six months, have you accidently leaked urine? N N  Do  you have problems with loss of bowel control? N N  Managing your Medications? N N  Managing your Finances? N N  Housekeeping or managing your Housekeeping? N N    Patient Care Team: [name], DO as PCP - General (Family Medicine)  Indicate any recent Medical Services you may have received from other than [facility] providers in the past year (date may be approximate).     Assessment:   This is a routine wellness examination for Woody.  Hearing/Vision screen Hearing Screening - Comments:: Bilateral hearing aids Vision Screening - Comments:: Last eye exam-03/2021  Dietary issues and exercise activities discussed: Current Exercise Habits: Home exercise routine, Type of exercise: walking, Time (Minutes): 30, Frequency (Times/Week): 7, Weekly Exercise (Minutes/Week): 210, Exercise limited by: None identified   Goals Addressed             This Visit's Progress    Maintain healthy active lifestyle.   On track      Depression Screen    10/15/2021    1:04 PM 09/24/2021    8:50 AM 04/27/2021    7:26 AM 09/19/2020    1:02 PM 05/17/2019    8:16 AM  PHQ 2/9 Scores  PHQ - 2 Score 0 0 1 0 0  PHQ- 9 Score  0       Fall Risk    10/15/2021    1:03 PM 10/13/2021    8:09 AM 10/07/2021    7:29 AM 09/24/2021    8:50 AM 04/27/2021    7:26 AM  [place] in the past year? 0 0 0   0 0 0  Number falls in past yr: 0 0 0   0 0 0  Injury with Fall? 0 0 0   0 0 0  Risk for fall due to : History of fall(s)    No Fall Risks No Fall Risks  Follow up Falls prevention discussed   Falls evaluation completed Falls evaluation completed    [place]:  Any stairs in or around the home? Yes  If so, are there any without handrails? No  Home free of loose throw rugs in walkways, pet beds, electrical cords, etc? Yes  Adequate lighting in your home to reduce risk of falls? Yes   ASSISTIVE DEVICES UTILIZED TO PREVENT FALLS:  Life alert? No  Use of a cane, walker or w/c? No  Grab bars in the bathroom? Yes  Shower chair or bench in shower? Yes  Elevated toilet seat or a handicapped toilet? No   TIMED UP AND GO:  Was the test performed? No . Phone visit   Cognitive Function:Normal cognitive status assessed by this Nurse Health Advisor. No abnormalities found.          Immunizations Immunization History  Administered Date(s) Administered   Fluad Quad(high Dose 65+) 01/27/2019   Influenza, High Dose Seasonal PF 02/17/2018, 02/04/2020   Influenza-Unspecified 01/13/2021   PFIZER(Purple Top)SARS-COV-2 Vaccination 05/21/2019, 06/11/2019, 02/04/2020, 08/01/2020   Pfizer Covid-19 Vaccine Bivalent Booster 62yr & up 01/13/2021   Pneumococcal Conjugate-13 03/20/2017, 11/12/2017   Pneumococcal Polysaccharide-23 12/06/2019   Td 05/07/2017   Zoster Recombinat (Shingrix) 11/12/2017, 02/27/2018    TDAP status: Up to date  Flu Vaccine status: Up to date  Pneumococcal vaccine status: Up to date  Covid-19 vaccine status: Completed vaccines  Qualifies for Shingles Vaccine? No   Zostavax completed No   Shingrix Completed?: Yes  Screening Tests Health Maintenance  Topic  Date Due   COVID-19 Vaccine (6 - Pfizer series) 05/16/2021   MAMMOGRAM  08/02/2021   INFLUENZA VACCINE  11/13/2021   COLONOSCOPY (Pts 45-75yr Insurance coverage will need to be confirmed)  04/12/2025   TETANUS/TDAP  05/08/2027   Pneumonia Vaccine 71 Years old  Completed   DEXA SCAN  Completed    Hepatitis C Screening  Completed   Zoster Vaccines- Shingrix  Completed   HPV VACCINES  Aged Out    Health Maintenance  Health Maintenance Due  Topic Date Due   COVID-19 Vaccine (6 - Pfizer series) 05/16/2021   MAMMOGRAM  08/02/2021    Colorectal cancer screening: Type of screening: Colonoscopy. Completed 04/12/2020. Repeat every 5 years  Mammogram status: Scheduled for 10/18/2021  Bone Density status: Declined  Lung Cancer Screening: (Low Dose CT Chest recommended if Age 71-80years, 30 pack-year currently smoking OR have quit w/in 15years.) does not qualify.     Additional Screening:  Hepatitis C Screening: Completed 05/07/2017  Vision Screening: Recommended annual ophthalmology exams for early detection of glaucoma and other disorders of the eye. Is the patient up to date with their annual eye exam?  Yes  Who is the provider or what is the name of the office in which the patient attends annual eye exams? unknown   Dental Screening: Recommended annual dental exams for proper oral hygiene  Community Resource Referral / Chronic Care Management: CRR required this visit?  No   CCM required this visit?  No      Plan:     I have personally reviewed and noted the following in the patient's chart:   Medical and social history Use of alcohol, tobacco or illicit drugs  Current medications and supplements including opioid prescriptions.  Functional ability and status Nutritional status Physical activity Advanced directives List of other physicians Hospitalizations, surgeries, and ER visits in previous 12 months Vitals Screenings to include cognitive, depression, and falls Referrals and appointments  In addition, I have reviewed and discussed with patient certain preventive protocols, quality metrics, and best practice recommendations. A written personalized care plan for preventive services as well as general preventive health recommendations were provided to  patient.   Due to this being a telephonic visit, the after visit summary with patients personalized plan was offered to patient via mail or my-chart. Patient would like to access on my-chart.   MMarta Antu LPN   70/05/1113 Nurse Health Advisor  Nurse Notes: None

## 2021-10-17 ENCOUNTER — Other Ambulatory Visit: Payer: Self-pay | Admitting: Family Medicine

## 2021-10-17 DIAGNOSIS — Z1231 Encounter for screening mammogram for malignant neoplasm of breast: Secondary | ICD-10-CM

## 2021-10-18 ENCOUNTER — Ambulatory Visit: Payer: Medicare Other

## 2021-10-26 DIAGNOSIS — Z1231 Encounter for screening mammogram for malignant neoplasm of breast: Secondary | ICD-10-CM

## 2021-12-19 ENCOUNTER — Other Ambulatory Visit: Payer: Self-pay | Admitting: Family Medicine

## 2022-01-04 DIAGNOSIS — Z23 Encounter for immunization: Secondary | ICD-10-CM | POA: Diagnosis not present

## 2022-01-14 ENCOUNTER — Other Ambulatory Visit: Payer: Self-pay | Admitting: Family Medicine

## 2022-02-06 ENCOUNTER — Ambulatory Visit: Payer: Medicare Other | Admitting: Family Medicine

## 2022-02-27 ENCOUNTER — Ambulatory Visit (INDEPENDENT_AMBULATORY_CARE_PROVIDER_SITE_OTHER): Payer: Medicare Other | Admitting: Family Medicine

## 2022-02-27 ENCOUNTER — Encounter: Payer: Self-pay | Admitting: Family Medicine

## 2022-02-27 ENCOUNTER — Other Ambulatory Visit: Payer: Self-pay | Admitting: Family Medicine

## 2022-02-27 VITALS — BP 118/64 | HR 70 | Temp 98.1°F | Ht 68.0 in | Wt 164.4 lb

## 2022-02-27 DIAGNOSIS — F339 Major depressive disorder, recurrent, unspecified: Secondary | ICD-10-CM

## 2022-02-27 DIAGNOSIS — E559 Vitamin D deficiency, unspecified: Secondary | ICD-10-CM

## 2022-02-27 DIAGNOSIS — M8580 Other specified disorders of bone density and structure, unspecified site: Secondary | ICD-10-CM

## 2022-02-27 DIAGNOSIS — Z1231 Encounter for screening mammogram for malignant neoplasm of breast: Secondary | ICD-10-CM

## 2022-02-27 DIAGNOSIS — E785 Hyperlipidemia, unspecified: Secondary | ICD-10-CM | POA: Diagnosis not present

## 2022-02-27 LAB — COMPREHENSIVE METABOLIC PANEL
ALT: 13 U/L (ref 0–35)
AST: 16 U/L (ref 0–37)
Albumin: 4.6 g/dL (ref 3.5–5.2)
Alkaline Phosphatase: 114 U/L (ref 39–117)
BUN: 12 mg/dL (ref 6–23)
CO2: 28 mEq/L (ref 19–32)
Calcium: 9.7 mg/dL (ref 8.4–10.5)
Chloride: 105 mEq/L (ref 96–112)
Creatinine, Ser: 0.78 mg/dL (ref 0.40–1.20)
GFR: 76.67 mL/min (ref >60.00)
Glucose, Bld: 98 mg/dL (ref 70–99)
Potassium: 4.7 mEq/L (ref 3.5–5.1)
Sodium: 140 mEq/L (ref 135–145)
Total Bilirubin: 0.5 mg/dL (ref 0.2–1.2)
Total Protein: 6.7 g/dL (ref 6.0–8.3)

## 2022-02-27 LAB — LIPID PANEL
Cholesterol: 204 mg/dL — ABNORMAL HIGH (ref 0–200)
HDL: 63 mg/dL (ref >39.00)
NonHDL: 140.59
Total CHOL/HDL Ratio: 3
Triglycerides: 289 mg/dL — ABNORMAL HIGH (ref 0.0–149.0)
VLDL: 57.8 mg/dL — ABNORMAL HIGH (ref 0.0–40.0)

## 2022-02-27 LAB — VITAMIN D 25 HYDROXY (VIT D DEFICIENCY, FRACTURES): VITD: 24.98 ng/mL — ABNORMAL LOW (ref 30.00–100.00)

## 2022-02-27 LAB — LDL CHOLESTEROL, DIRECT: Direct LDL: 103 mg/dL

## 2022-02-27 NOTE — Patient Instructions (Signed)
Give us 2-3 business days to get the results of your labs back.   Keep the diet clean and stay active.  Let us know if you need anything. 

## 2022-02-27 NOTE — Progress Notes (Signed)
Chief Complaint  Patient presents with   Follow-up    Subjective Paige Patel presents for f/u anxiety/depression.  Pt is currently being treated with Effexor XR 150 mg/d.  Reports doing well since treatment. No thoughts of harming self or others. No self-medication with alcohol, prescription drugs or illicit drugs. Pt is not following with a counselor/psychologist.  Hyperlipidemia Patient presents for dyslipidemia follow up. Currently being treated with Crestor 20 mg/d and compliance with treatment thus far has been good. She denies myalgias. She is adhering to a healthy diet. Exercise: walking, active around yard The patient is not known to have coexisting coronary artery disease. No CP or SOB.   Past Medical History:  Diagnosis Date   Allergy    seasonal   Arthritis    Depression    Frequent headaches    Neuromuscular disorder ([place])    neuropathy   Osteopenia    Osteoporosis    Allergies as of 02/27/2022   No Known Allergies      Medication List        Accurate as of February 27, 2022  8:04 AM. If you have any questions, ask your nurse or doctor.          fluticasone 50 MCG/ACT nasal spray Commonly known as: FLONASE Place 2 sprays into both nostrils daily.   gabapentin 100 MG capsule Commonly known as: NEURONTIN TAKE 1 CAPSULE IN THE MORNING AND 2 CAPSULES IN THE EVENING   gabapentin 300 MG capsule Commonly known as: NEURONTIN TAKE 1 CAPSULE BY MOUTH DAILY  ALONG WITH 100 MG AT NIGHT  DOSING   Olopatadine HCl 0.2 % Soln Apply 1 drop per eye twice daily.   omeprazole 10 MG capsule Commonly known as: PRILOSEC Take 10 mg by mouth daily.   rosuvastatin 20 MG tablet Commonly known as: CRESTOR Take 1 tablet (20 mg total) by mouth daily.   venlafaxine XR 150 MG 24 hr capsule Commonly known as: EFFEXOR-XR TAKE 1 CAPSULE BY MOUTH DAILY  WITH BREAKFAST        Exam BP 118/64 (BP Location: Left Arm, Patient Position: Sitting, Cuff Size: Normal)    Pulse 70   Temp 98.1 F (36.7 C) (Oral)   Ht '5\' 8"'$  (1.727 m)   Wt 164 lb 6 oz (74.6 kg)   SpO2 99%   BMI 24.99 kg/m  General:  well developed, well nourished, in no apparent distress Lungs:  No respiratory distress Psych: well oriented with normal range of affect and age-appropriate judgement/insight, alert and oriented x4.  Assessment and Plan  Depression, recurrent ([place])  Dyslipidemia - Plan: Lipid panel, Comprehensive metabolic panel  Encounter for screening mammogram for malignant neoplasm of breast - Plan: MM DIGITAL SCREENING BILATERAL  Osteopenia, unspecified location  Vitamin D insufficiency - Plan: VITAMIN D 25 Hydroxy (Vit-D Deficiency, Fractures)  Chronic, stable. Cont Effexor XR 150 mg/d.  Chronic, stable. Ck labs. Cont Crestor 20 mg/d. Counseled on diet/exercise.  Order for mammogram placed again.  Exercise, Vit D/Ca supp. Ck labs.  F/u in 6 mo. The patient voiced understanding and agreement to the plan.  [place], DO 02/27/22 8:04 AM

## 2022-03-12 ENCOUNTER — Encounter (HOSPITAL_BASED_OUTPATIENT_CLINIC_OR_DEPARTMENT_OTHER): Payer: Self-pay

## 2022-03-12 ENCOUNTER — Ambulatory Visit (HOSPITAL_BASED_OUTPATIENT_CLINIC_OR_DEPARTMENT_OTHER)
Admission: RE | Admit: 2022-03-12 | Discharge: 2022-03-12 | Disposition: A | Discharge status: Home / Self Care | Payer: Medicare Other | Source: Ambulatory Visit | Attending: Family Medicine | Admitting: Family Medicine

## 2022-03-12 DIAGNOSIS — Z1231 Encounter for screening mammogram for malignant neoplasm of breast: Secondary | ICD-10-CM | POA: Diagnosis not present

## 2022-03-20 ENCOUNTER — Encounter: Payer: Self-pay | Admitting: Family Medicine

## 2022-03-20 ENCOUNTER — Other Ambulatory Visit (INDEPENDENT_AMBULATORY_CARE_PROVIDER_SITE_OTHER): Payer: Medicare Other

## 2022-03-20 DIAGNOSIS — E785 Hyperlipidemia, unspecified: Secondary | ICD-10-CM

## 2022-03-20 LAB — LIPID PANEL
Cholesterol: 169 mg/dL (ref 0–200)
HDL: 64.4 mg/dL (ref >39.00)
LDL Cholesterol: 68 mg/dL (ref 0–99)
NonHDL: 104.19
Total CHOL/HDL Ratio: 3
Triglycerides: 180 mg/dL — ABNORMAL HIGH (ref 0.0–149.0)
VLDL: 36 mg/dL (ref 0.0–40.0)

## 2022-03-26 ENCOUNTER — Ambulatory Visit: Payer: Medicare Other | Admitting: Family Medicine

## 2022-04-18 ENCOUNTER — Encounter: Payer: Self-pay | Admitting: Family Medicine

## 2022-04-18 DIAGNOSIS — F339 Major depressive disorder, recurrent, unspecified: Secondary | ICD-10-CM

## 2022-04-19 ENCOUNTER — Other Ambulatory Visit: Payer: Self-pay | Admitting: Family Medicine

## 2022-04-19 MED ORDER — GABAPENTIN 100 MG PO CAPS: ORAL_CAPSULE | ORAL | 1 refills | Status: DC

## 2022-04-19 MED ORDER — VENLAFAXINE HCL ER 150 MG PO CP24: ORAL_CAPSULE | ORAL | 3 refills | Status: DC

## 2022-04-19 MED ORDER — GABAPENTIN 300 MG PO CAPS: ORAL_CAPSULE | ORAL | 3 refills | Status: DC

## 2022-04-19 MED ORDER — ROSUVASTATIN CALCIUM 40 MG PO TABS: 40.0000 mg | ORAL_TABLET | Freq: Every day | ORAL | 3 refills | Status: DC

## 2022-08-07 DIAGNOSIS — H16223 Keratoconjunctivitis sicca, not specified as Sjogren's, bilateral: Secondary | ICD-10-CM | POA: Diagnosis not present

## 2022-08-07 DIAGNOSIS — H2513 Age-related nuclear cataract, bilateral: Secondary | ICD-10-CM | POA: Diagnosis not present

## 2022-08-28 ENCOUNTER — Ambulatory Visit (INDEPENDENT_AMBULATORY_CARE_PROVIDER_SITE_OTHER): Payer: Medicare Other | Admitting: Family Medicine

## 2022-08-28 ENCOUNTER — Encounter: Payer: Self-pay | Admitting: Family Medicine

## 2022-08-28 VITALS — BP 120/80 | HR 49 | Temp 97.9°F | Ht 68.0 in | Wt 167.2 lb

## 2022-08-28 DIAGNOSIS — F325 Major depressive disorder, single episode, in full remission: Secondary | ICD-10-CM

## 2022-08-28 DIAGNOSIS — H6991 Unspecified Eustachian tube disorder, right ear: Secondary | ICD-10-CM

## 2022-08-28 DIAGNOSIS — E785 Hyperlipidemia, unspecified: Secondary | ICD-10-CM | POA: Diagnosis not present

## 2022-08-28 DIAGNOSIS — M792 Neuralgia and neuritis, unspecified: Secondary | ICD-10-CM | POA: Diagnosis not present

## 2022-08-28 NOTE — Patient Instructions (Addendum)
Keep the diet clean and stay active.  Go back on the Flonase please.   Let us know if you need anything.

## 2022-08-28 NOTE — Progress Notes (Signed)
Chief Complaint  Patient presents with   Follow-up    Subjective Paige Patel presents for f/u depression.  Pt is currently being treated with Effexor XR 150 mg/d.  Reports doing well since treatment. No thoughts of harming self or others. No self-medication with alcohol, prescription drugs or illicit drugs. Pt is not following with a counselor/psychologist.  Dyslipidemia Patient presents for dyslipidemia follow up. Currently being treated with Crestor 40 mg/d and compliance with treatment thus far has been good. She denies myalgias. She is usually adhering to a healthy diet. Exercise: lots of yard work The patient is not known to have coexisting coronary artery disease. No CP or SOB.   Neuropathic pain Affects feet, mainly at night. Takes gabapentin 400 mg qhs. Reports compliance, no AE's. Controls s/s's well.   Past Medical History:  Diagnosis Date   Allergy    seasonal   Arthritis    Depression    Frequent headaches    Neuromuscular disorder (HCC)    neuropathy   Osteopenia    Osteoporosis    Allergies as of 08/28/2022   No Known Allergies      Medication List        Accurate as of Aug 28, 2022  8:10 AM. If you have any questions, ask your nurse or doctor.          fluticasone 50 MCG/ACT nasal spray Commonly known as: FLONASE Place 2 sprays into both nostrils daily.   gabapentin 300 MG capsule Commonly known as: NEURONTIN TAKE 1 CAPSULE BY MOUTH DAILY  ALONG WITH 100 MG AT NIGHT  DOSING   gabapentin 100 MG capsule Commonly known as: NEURONTIN TAKE 1 CAPSULE IN THE MORNING AND 2 CAPSULES IN THE EVENING   Olopatadine HCl 0.2 % Soln Apply 1 drop per eye twice daily.   omeprazole 10 MG capsule Commonly known as: PRILOSEC Take 10 mg by mouth daily.   rosuvastatin 40 MG tablet Commonly known as: CRESTOR Take 1 tablet (40 mg total) by mouth daily.   venlafaxine XR 150 MG 24 hr capsule Commonly known as: EFFEXOR-XR TAKE 1 CAPSULE BY MOUTH DAILY   WITH BREAKFAST        Exam BP 120/80 (BP Location: Right Arm, Patient Position: Sitting, Cuff Size: Normal)   Pulse (!) 49   Temp 97.9 F (36.6 C) (Oral)   Ht 5\' 8"  (1.727 m)   Wt 167 lb 4 oz (75.9 kg)   SpO2 98%   BMI 25.43 kg/m  General:  well developed, well nourished, in no apparent distress Ears: patent, TM's neg MSK: No ttp, clicking of of TMJ, no jaw deviation Heart: Reg rhythm, bradycardic, no bruits, no LE edema Lungs:  CTAB. No respiratory distress Psych: well oriented with normal range of affect and age-appropriate judgement/insight, alert and oriented x4.  Assessment and Plan  Major depressive disorder with single episode, in full remission (HCC)  Dyslipidemia  Neuropathic pain  Chronic, stable. Cont Effexor XR 150 mg/d.  Chronic, stable. Cont Crestor 40 mg/d.  Chronic, stable. Cont Neurontin 400 mg qhs.  Likely ETD, will have her resume Flonase. F/u in 6 mo. The patient voiced understanding and agreement to the plan.  Jilda Roche [place], DO 08/28/22 8:10 AM

## 2022-08-29 IMAGING — DX DG HIP (WITH OR WITHOUT PELVIS) 2-3V*R*
3 series · 3 of 3 positions shown · non-contrast
Comparison: None.

CLINICAL DATA: Right hip pain.  No known injury.

EXAM:
DG HIP (WITH OR WITHOUT PELVIS) 2-3V RIGHT

[pelvis ap]
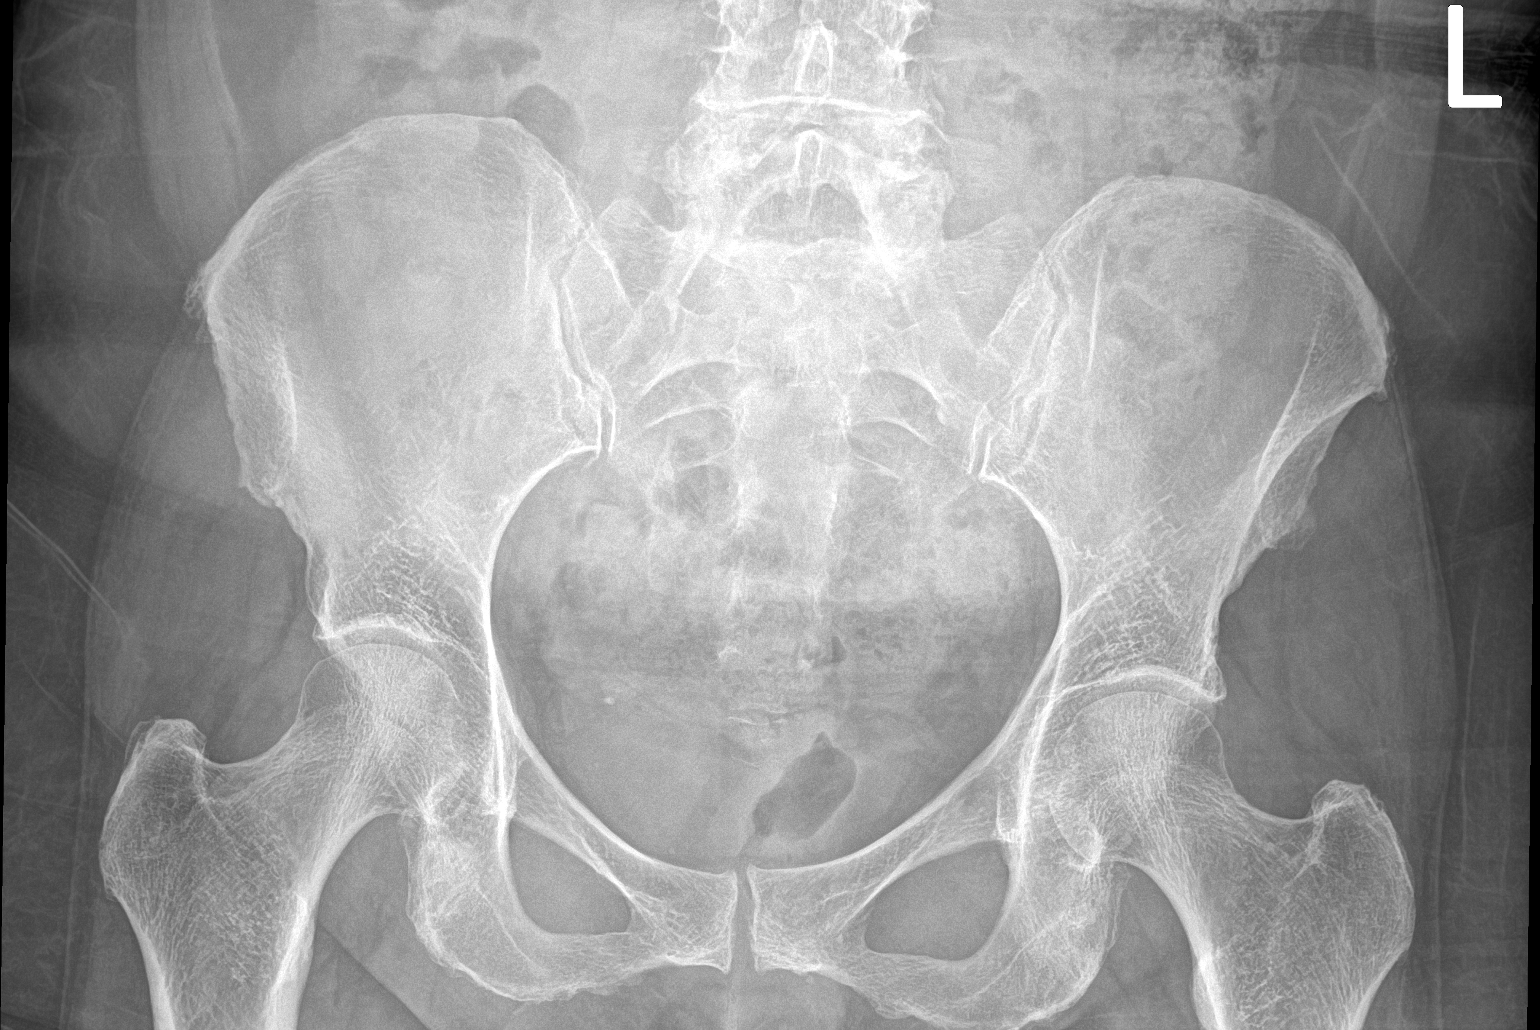

[hip ap]
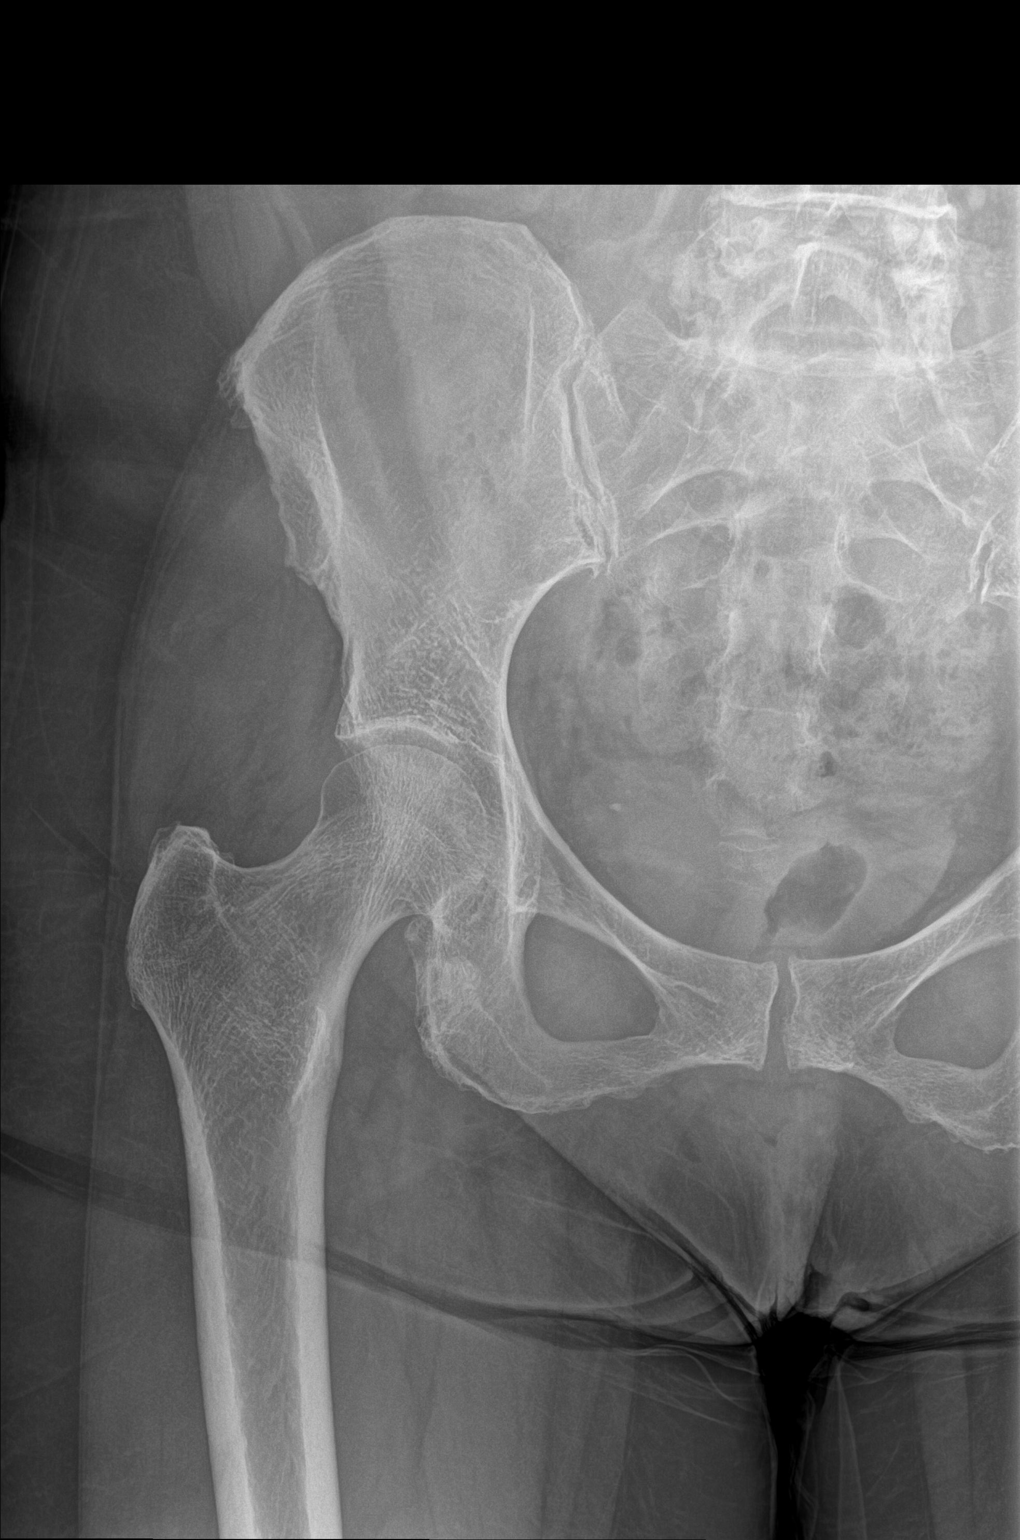

[hip frog leg]
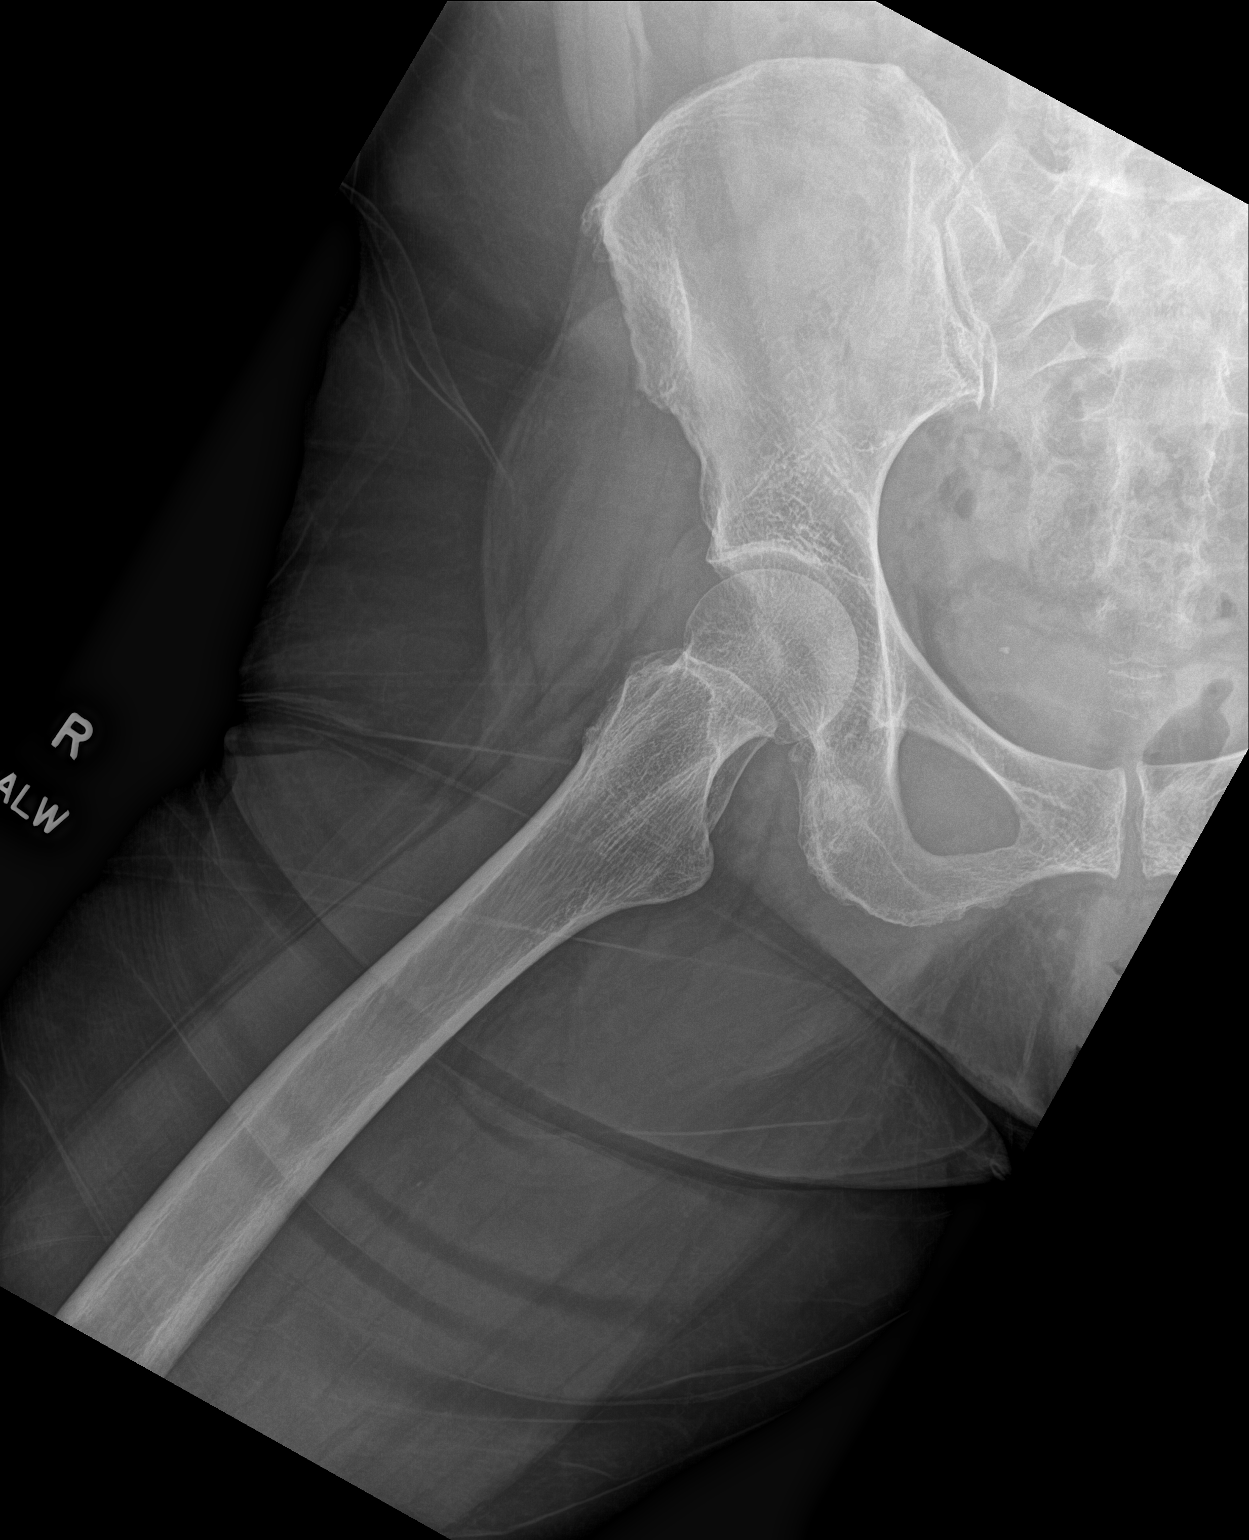

[3 of 3 positions shown; findings below may reference images not displayed]

FINDINGS: The hip joint space is preserved. The femoral head is well seated in
the acetabulum. Significant osteophytes or erosions. No avascular
necrosis. Periosteal reaction or bone destruction. No visualized
focal bone lesion. Pubic rami and remainder of the bony pelvis are
intact. Unremarkable soft tissues.
IMPRESSION: Negative radiographs of the right hip.

## 2022-10-02 ENCOUNTER — Other Ambulatory Visit: Payer: Self-pay | Admitting: Family Medicine

## 2022-11-29 ENCOUNTER — Other Ambulatory Visit: Payer: Self-pay

## 2022-11-29 ENCOUNTER — Ambulatory Visit
Admission: RE | Admit: 2022-11-29 | Discharge: 2022-11-29 | Disposition: A | Discharge status: Home / Self Care | Payer: Medicare Other | Source: Ambulatory Visit | Attending: Family Medicine | Admitting: Family Medicine

## 2022-11-29 ENCOUNTER — Encounter: Payer: Self-pay | Admitting: Family Medicine

## 2022-11-29 VITALS — BP 126/82 | HR 89 | Temp 97.8°F | Resp 16

## 2022-11-29 DIAGNOSIS — J069 Acute upper respiratory infection, unspecified: Secondary | ICD-10-CM | POA: Diagnosis not present

## 2022-11-29 LAB — POC SARS CORONAVIRUS 2 AG -  ED: SARS Coronavirus 2 Ag: NEGATIVE

## 2022-11-29 NOTE — Discharge Instructions (Signed)
Take plain guaifenesin (1200mg  extended release tabs such as Mucinex) twice daily, with plenty of water, for cough and congestion.  May add Pseudoephedrine (30mg , one or two every 4 to 6 hours) for sinus congestion.  Get adequate rest.   May use Afrin nasal spray (or generic oxymetazoline) each morning for about 5 days and then discontinue.  Also recommend using saline nasal spray several times daily and saline nasal irrigation (AYR is a common brand).  Use Flonase nasal spray each morning after using Afrin nasal spray and saline nasal irrigation. Try warm salt water gargles for sore throat.  Stop all antihistamines (Nyquil and Xyzal) for now, and other non-prescription cough/cold preparations. May take Delsym Cough Suppressant ("12 Hour Cough Relief") at bedtime for nighttime cough.   If symptoms become significantly worse during the night or over the weekend, proceed to the local emergency room.

## 2022-11-29 NOTE — ED Provider Notes (Signed)
[facility]    CSN: [id number] Arrival date & time: 11/29/22  1247      History   Chief Complaint Chief Complaint  Patient presents with   Cough    Respiratory infection. I have tested negative for Covid with home test. - Entered by patient    HPI Paige Patel is a 72 y.o. female.   Patient complains of five day history of typical cold-like symptoms developing over several days, including mild sore throat, sinus congestion, headache, fatigue, chills, and cough.  She has also had loose stools.  She denies pleuritic pain and shortness of breath.  She has had two negative home COVID tests.  She states that her sister-in-law had COVID infection one week ago.  The history is provided by the patient.    Past Medical History:  Diagnosis Date   Allergy    seasonal   Arthritis    Depression    Frequent headaches    Neuromuscular disorder (HCC)    neuropathy   Osteopenia    Osteoporosis     Patient Active Problem List   Diagnosis Date Noted   Neuropathic pain 12/06/2019   Medicare annual wellness visit, subsequent 05/15/2018   Dyslipidemia 11/12/2017   Osteopenia    Estrogen deficiency 05/07/2017   Depression     Past Surgical History:  Procedure Laterality Date   COLONOSCOPY  > 10 yrs   MANDIBLE SURGERY     OTHER SURGICAL HISTORY  1998   jaw surgery, balanced it    OB History   No obstetric history on file.      Home Medications    Prior to Admission medications   Medication Sig Start Date End Date Taking? Authorizing Provider  fluticasone (FLONASE) 50 MCG/ACT nasal spray Place 2 sprays into both nostrils daily. 12/06/19   Sharlene Dory, DO  gabapentin (NEURONTIN) 100 MG capsule TAKE 1 CAPSULE IN THE MORNING AND 2 CAPSULES IN THE EVENING 10/02/22   Wendling, Jilda Roche, DO  gabapentin (NEURONTIN) 300 MG capsule TAKE 1 CAPSULE BY MOUTH DAILY  ALONG WITH 100 MG AT NIGHT  DOSING 04/19/22   Wendling, Jilda Roche, DO  Olopatadine HCl 0.2 %  SOLN Apply 1 drop per eye twice daily. 12/06/19   Sharlene Dory, DO  omeprazole (PRILOSEC) 10 MG capsule Take 10 mg by mouth daily.    [provider]  rosuvastatin (CRESTOR) 40 MG tablet Take 1 tablet (40 mg total) by mouth daily. 04/19/22   Sharlene Dory, DO  venlafaxine XR (EFFEXOR-XR) 150 MG 24 hr capsule TAKE 1 CAPSULE BY MOUTH DAILY  WITH BREAKFAST 04/19/22   Wendling, Jilda Roche, DO    Family History Family History  Problem Relation Age of Onset   Alzheimer's disease Mother 60   High Cholesterol Mother    Cancer Father        Lung   High Cholesterol Father    Hypertension Brother    Breast cancer Paternal Aunt    Colon cancer Neg Hx    Colon polyps Neg Hx    Esophageal cancer Neg Hx    Rectal cancer Neg Hx    Stomach cancer Neg Hx     Social History Social History   Tobacco Use   Smoking status: Never   Smokeless tobacco: Never  Vaping Use   Vaping status: Never Used  Substance Use Topics   Alcohol use: Yes    Comment: seldom   Drug use: No     Allergies  Patient has no known allergies.   Review of Systems Review of Systems + sore throat + cough No pleuritic pain No wheezing + nasal congestion + post-nasal drainage No sinus pain/pressure No itchy/red eyes No earache No hemoptysis No SOB No fever, + chills No nausea No vomiting No abdominal pain + diarrhea No urinary symptoms No skin rash + fatigue No myalgias + headache Used OTC meds with (Nyquil, Xyzal) without relief   Physical Exam Triage Vital Signs ED Triage Vitals  Encounter Vitals Group     BP 11/29/22 1326 126/82     Systolic BP Percentile --      Diastolic BP Percentile --      Pulse Rate 11/29/22 1326 89     Resp 11/29/22 1326 16     Temp 11/29/22 1326 97.8 F (36.6 C)     Temp Source 11/29/22 1326 Oral     SpO2 11/29/22 1326 98 %     Weight --      Height --      Head Circumference --      Peak Flow --      Pain Score 11/29/22 1327 3      Pain Loc --      Pain Education --      Exclude from Growth Chart --    No data found.  Updated Vital Signs BP 126/82 (BP Location: Left Arm)   Pulse 89   Temp 97.8 F (36.6 C) (Oral)   Resp 16   SpO2 98%   Visual Acuity Right Eye Distance:   Left Eye Distance:   Bilateral Distance:    Right Eye Near:   Left Eye Near:    Bilateral Near:     Physical Exam Nursing notes and Vital Signs reviewed. Appearance:  Patient appears stated age, and in no acute distress Eyes:  Pupils are equal, round, and reactive to light and accomodation.  Extraocular movement is intact.  Conjunctivae are not inflamed  Ears:  Canals normal.  Tympanic membranes normal.  Nose:  Mildly congested turbinates.  No sinus tenderness.  Pharynx:  Normal Neck:  Supple.  Mildly enlarged lateral nodes are present, tender to palpation on the left.   Lungs:  Clear to auscultation.  Breath sounds are equal.  Moving air well. Heart:  Regular rate and rhythm without murmurs, rubs, or gallops.  Abdomen:  Nontender without masses or hepatosplenomegaly.  Bowel sounds are present.  No CVA or flank tenderness.  Extremities:  No edema.  Skin:  No rash present.   UC Treatments / Results  Labs (all labs ordered are listed, but only abnormal results are displayed) Labs Reviewed  POC SARS CORONAVIRUS 2 AG -  ED:  negative    EKG   Radiology No results found.  Procedures Procedures (including critical care time)  Medications Ordered in UC Medications - No data to display  Initial Impression / Assessment and Plan / UC Course  I have reviewed the triage vital signs and the nursing notes.  Pertinent labs & imaging results that were available during my care of the patient were reviewed by me and considered in my medical decision making (see chart for details).    Benign exam.  There is no evidence of bacterial infection today.  Treat symptomatically for now  Followup with Family Doctor if not improved in one  week.   Final Clinical Impressions(s) / UC Diagnoses   Final diagnoses:  Viral URI with cough     Discharge Instructions  Take plain guaifenesin (1200mg  extended release tabs such as Mucinex) twice daily, with plenty of water, for cough and congestion.  May add Pseudoephedrine (30mg , one or two every 4 to 6 hours) for sinus congestion.  Get adequate rest.   May use Afrin nasal spray (or generic oxymetazoline) each morning for about 5 days and then discontinue.  Also recommend using saline nasal spray several times daily and saline nasal irrigation (AYR is a common brand).  Use Flonase nasal spray each morning after using Afrin nasal spray and saline nasal irrigation. Try warm salt water gargles for sore throat.  Stop all antihistamines (Nyquil and Xyzal) for now, and other non-prescription cough/cold preparations. May take Delsym Cough Suppressant ("12 Hour Cough Relief") at bedtime for nighttime cough.   If symptoms become significantly worse during the night or over the weekend, proceed to the local emergency room.     ED Prescriptions   None       Lattie Haw, MD 11/30/22 1926

## 2022-11-29 NOTE — ED Triage Notes (Signed)
Since Sunday has had sore throat, cough, headache

## 2022-12-06 ENCOUNTER — Encounter: Payer: Self-pay | Admitting: Family Medicine

## 2022-12-06 ENCOUNTER — Ambulatory Visit (INDEPENDENT_AMBULATORY_CARE_PROVIDER_SITE_OTHER): Payer: Medicare Other | Admitting: Family Medicine

## 2022-12-06 VITALS — BP 108/66 | HR 88 | Temp 97.6°F | Ht 68.0 in | Wt 165.0 lb

## 2022-12-06 DIAGNOSIS — J0140 Acute pansinusitis, unspecified: Secondary | ICD-10-CM

## 2022-12-06 DIAGNOSIS — R051 Acute cough: Secondary | ICD-10-CM | POA: Diagnosis not present

## 2022-12-06 MED ORDER — AMOXICILLIN-POT CLAVULANATE 875-125 MG PO TABS: 1.0000 | ORAL_TABLET | Freq: Two times a day (BID) | ORAL | 0 refills | Status: DC

## 2022-12-06 MED ORDER — GUAIFENESIN ER 600 MG PO TB12: 1200.0000 mg | ORAL_TABLET | Freq: Two times a day (BID) | ORAL | 0 refills | Status: DC

## 2022-12-06 MED ORDER — BENZONATATE 200 MG PO CAPS: 200.0000 mg | ORAL_CAPSULE | Freq: Two times a day (BID) | ORAL | 0 refills | Status: DC | PRN

## 2022-12-06 NOTE — Progress Notes (Signed)
Acute Office Visit  Subjective:     Patient ID: Paige Patel, female    DOB: 10-22-1950, 72 y.o.   MRN: [id number]  Chief Complaint  Patient presents with   Nasal Congestion    HPI Patient is in today for URI symptoms.   Discussed the use of AI scribe software for clinical note transcription with the patient, who gave verbal consent to proceed.  History of Present Illness   The patient presented with a two-week history of upper respiratory symptoms. Initially, the symptoms began with a sore throat and a severe cough, described as a 'honk,' which would persist for hours. Over time, the cough has improved, but is still present at times.  The patient reported that the symptoms seem to be migrating upwards, causing significant sinus pressure and headaches. They also noted sensitivity and pain in the right side of their face, an area previously affected by surgery.  In addition to these symptoms, the patient reported significant fatigue and a feeling of being 'run down.' They denied any recent fevers, but noted a significant amount of nasal congestion and postnasal drainage. The patient also reported discomfort in their ears, describing it as pressure, but denied any popping sensation.  The patient has been managing their symptoms with over-the-counter medications, including Nyquil at night, and alternating ibuprofen and acetaminophen. They reported waking up two to three times a night due to coughing, but denied any other sleep disturbances.  The patient also reported a decrease in appetite, which they attributed to the postnasal drainage. Despite these symptoms, the patient's lung sounds were reported as clear, and they denied any shortness of breath, unless it triggered the cough.  The patient has not been on any recent antibiotics and has been maintaining hydration. They also reported taking a probiotic regularly.           ROS All review of systems negative except what is  listed in the HPI      Objective:    BP 108/66   Pulse 88   Temp 97.6 F (36.4 C) (Oral)   Ht 5\' 8"  (1.727 m)   Wt 165 lb (74.8 kg)   SpO2 98%   BMI 25.09 kg/m    Physical Exam Vitals reviewed.  Constitutional:      General: She is not in acute distress.    Appearance: Normal appearance.  HENT:     Head: Normocephalic and atraumatic.     Comments: Frontal/maxillary sinuses tender to palpation     Nose: Congestion and rhinorrhea present.     Mouth/Throat:     Comments: Cobblestoning/PND Cardiovascular:     Rate and Rhythm: Normal rate and regular rhythm.     Heart sounds: Normal heart sounds.  Pulmonary:     Effort: Pulmonary effort is normal.     Breath sounds: Normal breath sounds. No wheezing, rhonchi or rales.  Musculoskeletal:     Cervical back: Normal range of motion and neck supple. No tenderness.  Lymphadenopathy:     Cervical: No cervical adenopathy.  Skin:    General: Skin is warm and dry.  Neurological:     Mental Status: She is alert and oriented to person, place, and time.  Psychiatric:        Mood and Affect: Mood normal.        Behavior: Behavior normal.        Thought Content: Thought content normal.        Judgment: Judgment normal.     No  results found for any visits on 12/06/22.      Assessment & Plan:   Problem List Items Addressed This Visit   None Visit Diagnoses     Acute non-recurrent pansinusitis    -  Primary   Relevant Medications   amoxicillin-clavulanate (AUGMENTIN) 875-125 MG tablet   guaiFENesin (MUCINEX) 600 MG 12 hr tablet   benzonatate (TESSALON) 200 MG capsule   Acute cough       Relevant Medications   guaiFENesin (MUCINEX) 600 MG 12 hr tablet   benzonatate (TESSALON) 200 MG capsule         Upper Respiratory Infection/Sinusitis Persistent symptoms for two weeks with initial sore throat and cough, now with sinus congestion, drainage, and headache. No fever or shortness of breath. -Start Augmentin for suspected  sinus infection. -Continue ibuprofen and acetaminophen as needed for pain and discomfort. -Continue Flonase two sprays in each nostril once daily and saline spray as needed. -Start Mucinex to help break up congestion. -Start prescription cough pearls twice daily as needed -Check in if not improved by Monday.         Meds ordered this encounter  Medications   amoxicillin-clavulanate (AUGMENTIN) 875-125 MG tablet    Sig: Take 1 tablet by mouth 2 (two) times daily.    Dispense:  20 tablet    Refill:  0    Order Specific Question:   Supervising Provider    Answer:   Danise Edge A [[id number]]   guaiFENesin (MUCINEX) 600 MG 12 hr tablet    Sig: Take 2 tablets (1,200 mg total) by mouth 2 (two) times daily.    Dispense:  30 tablet    Refill:  0    Order Specific Question:   Supervising Provider    Answer:   Danise Edge A [[id number]]   benzonatate (TESSALON) 200 MG capsule    Sig: Take 1 capsule (200 mg total) by mouth 2 (two) times daily as needed for cough.    Dispense:  20 capsule    Refill:  0    Order Specific Question:   Supervising Provider    Answer:   Danise Edge A [[id number]]    Return if symptoms worsen or fail to improve.  Clayborne Dana, NP

## 2022-12-06 NOTE — Patient Instructions (Signed)
The following information is provided as a general resource for ADULT patients only and does NOT take into account PREGNANCY, ALLERGIES, LIVER CONDITIONS, KIDNEY CONDITIONS, GASTROINTESTINAL CONDITIONS, OR PRESCRIPTION MEDICATION INTERACTIONS. Please be sure to ask your provider if the following are safe to take with your specific medical history, conditions, or current medication regimen if you are unsure.   Adult Basic Symptom Management for Sinusitis  Congestion: Guaifenesin (Mucinex)- follow directions on packaging with a maximum dose of 2400mg in a 24 hour period.  Pain/Fever: Ibuprofen 200mg - 400mg every 4-6 hours as needed (MAX 1200mg in a 24 hour period) Pain/Fever: Tylenol 500mg -1000mg every 6-8 hours as needed (MAX 3000mg in a 24 hour period)  Cough: Dextromethorphan (Delsym)- follow directions on packing with a maximum dose of 120mg in a 24 hour period.  Nasal Stuffiness: Saline nasal spray and/or Nettie Pot with sterile saline solution  Runny Nose: Fluticasone nasal spray (Flonase) OR Mometasone nasal spray (Nasonex) OR Triamcinolone Acetonide nasal spray (Nasacort)- follow directions on the packaging  Pain/Pressure: Warm washcloth to the face  Sore Throat: Warm salt water gargles  If you have allergies, you may also consider taking an oral antihistamine (like Zyrtec or Claritin) as these may also help with your symptoms.  **Many medications will have more than one ingredient, be sure you are reading the packaging carefully and not taking more than one dose of the same kind of medication at the same time or too close together. It is OK to use formulas that have all of the ingredients you want, but do not take them in a combined medication and as separate dose too close together. If you have any questions, the pharmacist will be happy to help you decide what is safe.    

## 2022-12-13 ENCOUNTER — Other Ambulatory Visit: Payer: Self-pay | Admitting: Medical Genetics

## 2022-12-13 DIAGNOSIS — Z006 Encounter for examination for normal comparison and control in clinical research program: Secondary | ICD-10-CM

## 2023-02-19 ENCOUNTER — Ambulatory Visit (INDEPENDENT_AMBULATORY_CARE_PROVIDER_SITE_OTHER): Payer: Medicare Other | Admitting: Family Medicine

## 2023-02-19 ENCOUNTER — Ambulatory Visit (HOSPITAL_BASED_OUTPATIENT_CLINIC_OR_DEPARTMENT_OTHER)
Admission: RE | Admit: 2023-02-19 | Discharge: 2023-02-19 | Disposition: A | Discharge status: Home / Self Care | Payer: Medicare Other | Source: Ambulatory Visit | Attending: Family Medicine | Admitting: Family Medicine

## 2023-02-19 VITALS — BP 119/63 | HR 71 | Ht 68.0 in | Wt 167.0 lb

## 2023-02-19 DIAGNOSIS — M1811 Unilateral primary osteoarthritis of first carpometacarpal joint, right hand: Secondary | ICD-10-CM | POA: Diagnosis not present

## 2023-02-19 DIAGNOSIS — S52511A Displaced fracture of right radial styloid process, initial encounter for closed fracture: Secondary | ICD-10-CM | POA: Diagnosis not present

## 2023-02-19 DIAGNOSIS — S62101A Fracture of unspecified carpal bone, right wrist, initial encounter for closed fracture: Secondary | ICD-10-CM | POA: Diagnosis not present

## 2023-02-19 DIAGNOSIS — M25531 Pain in right wrist: Secondary | ICD-10-CM | POA: Diagnosis not present

## 2023-02-19 DIAGNOSIS — W19XXXA Unspecified fall, initial encounter: Secondary | ICD-10-CM

## 2023-02-19 DIAGNOSIS — S62111A Displaced fracture of triquetrum [cuneiform] bone, right wrist, initial encounter for closed fracture: Secondary | ICD-10-CM | POA: Insufficient documentation

## 2023-02-19 DIAGNOSIS — S52571A Other intraarticular fracture of lower end of right radius, initial encounter for closed fracture: Secondary | ICD-10-CM | POA: Insufficient documentation

## 2023-02-19 NOTE — Progress Notes (Signed)
Acute Office Visit  Subjective:     Patient ID: Paige Patel, female    DOB: 06-Oct-1950, 72 y.o.   MRN: [id number]  Chief Complaint  Patient presents with   Fall   Wrist Pain     Patient is in today for fall with ongoing right wrist pain.   Discussed the use of AI scribe software for clinical note transcription with the patient, who gave verbal consent to proceed.  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 The incident occurred when they were backing away from a goat and tripped over a rock. They did not hit their head during the fall, but they began experiencing pain in their right wrist, which did not present immediately after the fall. The pain, described as a throbbing sensation, is located primarily in the forearm, above the wrist, and is exacerbated when using their thumb or applying pressure. The patient also reports swelling in the wrist, which intermittently subsides and then worsens, causing increased discomfort. They have tried to manage the pain and swelling with braces and bandages, but the braces cause itching. The patient also reports occasional numbness and tingling in the wrist when the swelling is severe.           ROS All review of systems negative except what is listed in the HPI      Objective:    BP 119/63   Pulse 71   Ht 5\' 8"  (1.727 m)   Wt 167 lb (75.8 kg)   SpO2 100%   BMI 25.39 kg/m    Physical Exam Vitals reviewed.  Constitutional:      Appearance: Normal appearance.  Musculoskeletal:        General: Swelling and tenderness present.     Comments: Right wrist with generalized edema and healing ecchymosis; pain reported mostly medial, thumb to distal forearm, not worsened with palpation  Skin:    Findings: Bruising present.  Neurological:     Mental Status: She is alert and oriented to person, place, and time.  Psychiatric:        Mood and  Affect: Mood normal.        Behavior: Behavior normal.        Thought Content: Thought content normal.        Judgment: Judgment normal.         No results found for any visits on 02/19/23.      Assessment & Plan:   Problem List Items Addressed This Visit   None Visit Diagnoses     Fall, initial encounter    -  Primary   Relevant Orders   DG Wrist Complete Right (Completed)   AMB referral to orthopedics   Right wrist pain       Relevant Orders   DG Wrist Complete Right (Completed)   AMB referral to orthopedics         Right Wrist Pain Pain and swelling in the right wrist following a fall a week ago. Pain is worse with movement and use of thumb. Some numbness and tingling noted with increased swelling. No head injury or other injuries reported from the fall. -Order wrist X-ray to rule out fracture. -Advise to continue with immobilization using a brace -If X-ray shows signs of fracture, refer to orthopedics.        No orders of the defined types were placed in this encounter.  Return if symptoms worsen or fail to improve.  Clayborne Dana, NP

## 2023-02-20 ENCOUNTER — Ambulatory Visit (INDEPENDENT_AMBULATORY_CARE_PROVIDER_SITE_OTHER): Payer: Medicare Other | Admitting: Student

## 2023-02-20 ENCOUNTER — Telehealth (HOSPITAL_BASED_OUTPATIENT_CLINIC_OR_DEPARTMENT_OTHER): Payer: Self-pay

## 2023-02-20 ENCOUNTER — Encounter (HOSPITAL_BASED_OUTPATIENT_CLINIC_OR_DEPARTMENT_OTHER): Payer: Self-pay | Admitting: Student

## 2023-02-20 DIAGNOSIS — S52511A Displaced fracture of right radial styloid process, initial encounter for closed fracture: Secondary | ICD-10-CM | POA: Diagnosis not present

## 2023-02-20 DIAGNOSIS — M25331 Other instability, right wrist: Secondary | ICD-10-CM | POA: Diagnosis not present

## 2023-02-20 NOTE — Telephone Encounter (Signed)
Reminder to talk to Dr. Fara Boros about this patient

## 2023-02-20 NOTE — Progress Notes (Signed)
Chief Complaint: Right wrist pain     History of Present Illness:    Paige Patel is a 72 y.o. right hand dominant female presenting today with 1 week history of right wrist pain.  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  She was working around the goats and backed up into a large rock which caused her to fall backwards.  She used her right hand to break the fall.  This was not immediately painful however pain began to increase within a few hours.  She did go in for evaluation with her PCP who ordered an x-ray.  Pain today is located mainly over the thumb side of the wrist up to the base of the thumb.  She has been icing and taking ibuprofen and Tylenol.  She does leave town in 3 weeks and returns in March part of which involves helping build a house for [employer] in [place].   Surgical History:   None  PMH/PSH/Family History/Social History/Meds/Allergies:    Past Medical History:  Diagnosis Date   Allergy    seasonal   Arthritis    Depression    Frequent headaches    Neuromuscular disorder (HCC)    neuropathy   Osteopenia    Osteoporosis    Past Surgical History:  Procedure Laterality Date   COLONOSCOPY  > 10 yrs   MANDIBLE SURGERY     OTHER SURGICAL HISTORY  1998   jaw surgery, balanced it   Social History   Socioeconomic History   Marital status: Widowed    Spouse name: Not on file   Number of children: Not on file   Years of education: Not on file   Highest education level: Bachelor's degree (e.g., BA, AB, BS)  Occupational History   Not on file  Tobacco Use   Smoking status: Never   Smokeless tobacco: Never  Vaping Use   Vaping status: Never Used  Substance and Sexual Activity   Alcohol use: Yes    Comment: seldom   Drug use: No   Sexual activity: Not on file  Other Topics Concern   Not on file  Social History Narrative   Not on file   Social  Determinants of Health   Financial Resource Strain: Low Risk  (02/19/2023)   Overall Financial Resource Strain (CARDIA)    Difficulty of Paying Living Expenses: Not hard at all  Food Insecurity: No Food Insecurity (02/19/2023)   Hunger Vital Sign    Worried About Running Out of Food in the Last Year: Never true    Ran Out of Food in the Last Year: Never true  Transportation Needs: No Transportation Needs (02/19/2023)   PRAPARE - Administrator, Civil Service (Medical): No    Lack of Transportation (Non-Medical): No  Physical Activity: Sufficiently Active (02/19/2023)   Exercise Vital Sign    Days of Exercise per Week: 7 days    Minutes of Exercise per Session: 40 min  Stress: Stress Concern Present (02/19/2023)   [employer] of Occupational Health - Occupational Stress Questionnaire    Feeling of Stress : To some extent  Social Connections: Socially Isolated (02/19/2023)   Social Connection and Isolation Panel [NHANES]    Frequency of Communication with Friends and Family: Once a week  Frequency of Social Gatherings with Friends and Family: Once a week    Attends Religious Services: Never    Database administrator or Organizations: Yes    Attends Engineer, structural: More than 4 times per year    Marital Status: Widowed   Family History  Problem Relation Age of Onset   Alzheimer's disease Mother 61   High Cholesterol Mother    Cancer Father        Lung   High Cholesterol Father    Hypertension Brother    Breast cancer Paternal Aunt    Colon cancer Neg Hx    Colon polyps Neg Hx    Esophageal cancer Neg Hx    Rectal cancer Neg Hx    Stomach cancer Neg Hx    No Known Allergies Current Outpatient Medications  Medication Sig Dispense Refill   fluticasone (FLONASE) 50 MCG/ACT nasal spray Place 2 sprays into both nostrils daily. 48 g 2   gabapentin (NEURONTIN) 100 MG capsule TAKE 1 CAPSULE IN THE MORNING AND 2 CAPSULES IN THE EVENING 270 capsule 3    gabapentin (NEURONTIN) 300 MG capsule TAKE 1 CAPSULE BY MOUTH DAILY  ALONG WITH 100 MG AT NIGHT  DOSING 90 capsule 3   Olopatadine HCl 0.2 % SOLN Apply 1 drop per eye twice daily. 7.5 mL 0   rosuvastatin (CRESTOR) 40 MG tablet Take 1 tablet (40 mg total) by mouth daily. 90 tablet 3   venlafaxine XR (EFFEXOR-XR) 150 MG 24 hr capsule TAKE 1 CAPSULE BY MOUTH DAILY  WITH BREAKFAST 90 capsule 3   No current facility-administered medications for this visit.   DG Wrist Complete Right  Result Date: 02/19/2023 CLINICAL DATA:  Right wrist pain after fall last week. EXAM: RIGHT WRIST - COMPLETE 3+ VIEW COMPARISON:  None Available. FINDINGS: Minimally displaced fracture is seen involving the radial styloid region with intra-articular extension. Minimally displaced triquetral fracture is also noted posteriorly. Widening of scapholunate space is noted suggesting injury of the scapholunate ligament. Mild degenerative changes seen involving the first carpometacarpal joint. IMPRESSION: Minimally displaced distal radial fracture with intra-articular extension. Minimally displaced triquetral fracture is noted posteriorly. Widening of scapholunate space is noted suggesting injury of scapholunate ligament. Electronically Signed   By: Lupita Raider M.D.   On: 02/19/2023 10:28    Review of Systems:   A ROS was performed including pertinent positives and negatives as documented in the HPI.  Physical Exam :   Constitutional: NAD and appears stated age Neurological: Alert and oriented Psych: Appropriate affect and cooperative There were no vitals taken for this visit.   Comprehensive Musculoskeletal Exam:    Right wrist appears mild to moderately swollen with no evidence of obvious deformity.  Mild tenderness over the distal forearm and carpal bones most notable over the dorsal distal radius.  Patient is able to form a fist with good strength.  Able to perform 30 degrees of active wrist flexion extension with minimal  discomfort.  Radial pulse 2+.  Distal neurosensory exam is intact.  Imaging:   Xray review from yesterday 02/19/23 (right wrist 3 views): Mildly displaced radial styloid fracture with intra-articular involvement.  Widening of the scapholunate interval.  Small fracture of the triquetrum with posterior displacement noted on the lateral view.   I personally reviewed and interpreted the radiographs.   Assessment:   72 y.o. female with evidence of a Chauffeur fracture and associated scapholunate ligament widening of the right wrist from a FOOSH injury 1  week ago.  There also appears to be a small triquetral fracture.  The fractures do not have any significant displacement therefore we can proceed with immobilization and conservative treatment.  I have consulted with our hand surgeon Dr. Denese Killings regarding the SL injury who does not recommend any aggressive treatment.  Will continue to monitor this for chronic issues and subsequent arthritis.  I would like to have her return to see me or Dr. Denese Killings before leaving on her trip to reassess fracture healing.  Plan :    -Return to clinic in 3 weeks for reassessment     I personally saw and evaluated the patient, and participated in the management and treatment plan.  Hazle Nordmann, PA-C Orthopedics

## 2023-02-21 NOTE — Addendum Note (Signed)
Addended by: Barbette Or on: 02/21/2023 09:38 AM   Modules accepted: Orders

## 2023-02-21 NOTE — Telephone Encounter (Signed)
Discussed x-rays with Dr. Fara Boros who recommends proceeding with conservative management.  I will plan to see her back in 3 weeks for follow up before she leaves on her trip to assess fracture healing.

## 2023-02-24 ENCOUNTER — Other Ambulatory Visit (HOSPITAL_COMMUNITY)
Admission: RE | Admit: 2023-02-24 | Discharge: 2023-02-24 | Disposition: A | Discharge status: Home / Self Care | Payer: Medicare Other | Source: Ambulatory Visit | Attending: Oncology | Admitting: Oncology

## 2023-02-24 ENCOUNTER — Telehealth: Payer: Self-pay | Admitting: Family Medicine

## 2023-02-24 DIAGNOSIS — Z006 Encounter for examination for normal comparison and control in clinical research program: Secondary | ICD-10-CM | POA: Insufficient documentation

## 2023-02-24 NOTE — Telephone Encounter (Signed)
Copied from CRM [id number]. Topic: Medicare AWV >> Feb 24, 2023  1:59 PM Payton Doughty wrote: Reason for CRM: Called LVM 02/24/2023 to schedule Annual Wellness Visit  Verlee Rossetti; Care Guide Ambulatory Clinical Support [facility] Direct Dial: [phone]

## 2023-02-28 ENCOUNTER — Ambulatory Visit: Payer: Medicare Other | Admitting: Orthopedic Surgery

## 2023-03-03 ENCOUNTER — Ambulatory Visit: Payer: Medicare Other | Admitting: Family Medicine

## 2023-03-03 ENCOUNTER — Encounter: Payer: Self-pay | Admitting: Family Medicine

## 2023-03-03 VITALS — BP 126/74 | HR 73 | Temp 98.0°F | Resp 16 | Ht 68.0 in | Wt 165.0 lb

## 2023-03-03 DIAGNOSIS — E2839 Other primary ovarian failure: Secondary | ICD-10-CM | POA: Diagnosis not present

## 2023-03-03 DIAGNOSIS — Z23 Encounter for immunization: Secondary | ICD-10-CM

## 2023-03-03 DIAGNOSIS — F325 Major depressive disorder, single episode, in full remission: Secondary | ICD-10-CM

## 2023-03-03 DIAGNOSIS — E785 Hyperlipidemia, unspecified: Secondary | ICD-10-CM | POA: Diagnosis not present

## 2023-03-03 DIAGNOSIS — M792 Neuralgia and neuritis, unspecified: Secondary | ICD-10-CM | POA: Diagnosis not present

## 2023-03-03 LAB — COMPREHENSIVE METABOLIC PANEL
ALT: 19 U/L (ref 0–35)
AST: 20 U/L (ref 0–37)
Albumin: 4.6 g/dL (ref 3.5–5.2)
Alkaline Phosphatase: 105 U/L (ref 39–117)
BUN: 13 mg/dL (ref 6–23)
CO2: 26 meq/L (ref 19–32)
Calcium: 9.7 mg/dL (ref 8.4–10.5)
Chloride: 103 meq/L (ref 96–112)
Creatinine, Ser: 0.88 mg/dL (ref 0.40–1.20)
GFR: 65.87 mL/min (ref >60.00)
Glucose, Bld: 96 mg/dL (ref 70–99)
Potassium: 4 meq/L (ref 3.5–5.1)
Sodium: 141 meq/L (ref 135–145)
Total Bilirubin: 0.6 mg/dL (ref 0.2–1.2)
Total Protein: 6.6 g/dL (ref 6.0–8.3)

## 2023-03-03 LAB — LIPID PANEL
Cholesterol: 142 mg/dL (ref 0–200)
HDL: 57 mg/dL (ref >39.00)
LDL Cholesterol: 56 mg/dL (ref 0–99)
NonHDL: 84.88
Total CHOL/HDL Ratio: 2
Triglycerides: 143 mg/dL (ref 0.0–149.0)
VLDL: 28.6 mg/dL (ref 0.0–40.0)

## 2023-03-03 NOTE — Progress Notes (Signed)
Chief Complaint  Patient presents with   Follow-up    Follow up    Subjective Paige Patel presents for f/u depression.  Pt is currently being treated with Effexor XR 150 mg/d.  Reports doing well since treatment. No thoughts of harming self or others. No self-medication with alcohol, prescription drugs or illicit drugs. Pt is not following with a counselor/psychologist.  Dyslipidemia Patient presents for dyslipidemia follow up. Currently being treated with Crestor 40 and compliance with treatment thus far has been good. She denies myalgias. She is adhering to a healthy diet. Exercise: walking No CP or SOB.  The patient is not known to have coexisting coronary artery disease.  Neuropathic pain Taking gabapentin 500 mg qhs and 100 mg in AM. Usually works well. No Ae's.   Past Medical History:  Diagnosis Date   Allergy    seasonal   Arthritis    Depression    Frequent headaches    Neuromuscular disorder (HCC)    neuropathy   Osteopenia    Osteoporosis    Allergies as of 03/03/2023   No Known Allergies      Medication List        Accurate as of March 03, 2023  8:00 AM. If you have any questions, ask your nurse or doctor.          fluticasone 50 MCG/ACT nasal spray Commonly known as: FLONASE Place 2 sprays into both nostrils daily.   gabapentin 300 MG capsule Commonly known as: NEURONTIN TAKE 1 CAPSULE BY MOUTH DAILY  ALONG WITH 100 MG AT NIGHT  DOSING   gabapentin 100 MG capsule Commonly known as: NEURONTIN TAKE 1 CAPSULE IN THE MORNING AND 2 CAPSULES IN THE EVENING   Olopatadine HCl 0.2 % Soln Apply 1 drop per eye twice daily.   rosuvastatin 40 MG tablet Commonly known as: CRESTOR Take 1 tablet (40 mg total) by mouth daily.   venlafaxine XR 150 MG 24 hr capsule Commonly known as: EFFEXOR-XR TAKE 1 CAPSULE BY MOUTH DAILY  WITH BREAKFAST        Exam BP 126/74 (BP Location: Left Arm, Patient Position: Sitting, Cuff Size: Normal)   Pulse  73   Temp 98 F (36.7 C) (Oral)   Resp 16   Ht 5\' 8"  (1.727 m)   Wt 165 lb (74.8 kg)   SpO2 99%   BMI 25.09 kg/m  General:  well developed, well nourished, in no apparent distress Heart: RRR, no bruits, no LE edema.  Lungs: CTAB.  No respiratory distress Psych: well oriented with normal range of affect and age-appropriate judgement/insight, alert and oriented x4.  Assessment and Plan  Major depressive disorder with single episode, in full remission (HCC)  Dyslipidemia - Plan: Comprehensive metabolic panel, Lipid panel  Neuropathic pain  Estrogen deficiency - Plan: DG Bone Density  Chronic, stable.  Continue Effexor XR 150 mg daily. Chronic, stable.  Check labs.  Continue Crestor 40 mg daily.  Counseled on diet and exercise. Chronic, stable.  Continue gabapentin 100 mg in the morning, 500 mg in the evening.  She is not having any side effects with this. Recent fracture of her radius, will check a bone density scan as she is due. Flu shot today. F/u in 6 months. The patient voiced understanding and agreement to the plan.  Jilda Roche Ada, DO 03/03/23 8:00 AM

## 2023-03-03 NOTE — Patient Instructions (Addendum)
Give [place] 2-3 business days to get the results of your labs back.   Keep the diet clean and stay active.  Someone will reach out regarding your bone density scan.   Let us know if you need anything.

## 2023-03-04 LAB — HELIX MOLECULAR SCREEN: Genetic Analysis Overall Interpretation: NEGATIVE

## 2023-03-04 LAB — GENECONNECT MOLECULAR SCREEN

## 2023-03-05 ENCOUNTER — Ambulatory Visit (HOSPITAL_BASED_OUTPATIENT_CLINIC_OR_DEPARTMENT_OTHER)
Admission: RE | Admit: 2023-03-05 | Discharge: 2023-03-05 | Disposition: A | Discharge status: Home / Self Care | Payer: Medicare Other | Source: Ambulatory Visit | Attending: Family Medicine | Admitting: Family Medicine

## 2023-03-05 DIAGNOSIS — E2839 Other primary ovarian failure: Secondary | ICD-10-CM | POA: Diagnosis not present

## 2023-03-05 DIAGNOSIS — M8589 Other specified disorders of bone density and structure, multiple sites: Secondary | ICD-10-CM | POA: Diagnosis not present

## 2023-03-05 DIAGNOSIS — Z780 Asymptomatic menopausal state: Secondary | ICD-10-CM | POA: Diagnosis not present

## 2023-03-12 ENCOUNTER — Encounter (HOSPITAL_BASED_OUTPATIENT_CLINIC_OR_DEPARTMENT_OTHER): Payer: Self-pay | Admitting: Student

## 2023-03-12 ENCOUNTER — Ambulatory Visit (HOSPITAL_BASED_OUTPATIENT_CLINIC_OR_DEPARTMENT_OTHER): Payer: Medicare Other

## 2023-03-12 ENCOUNTER — Ambulatory Visit (HOSPITAL_BASED_OUTPATIENT_CLINIC_OR_DEPARTMENT_OTHER): Payer: Medicare Other | Admitting: Student

## 2023-03-12 DIAGNOSIS — S52511A Displaced fracture of right radial styloid process, initial encounter for closed fracture: Secondary | ICD-10-CM

## 2023-03-12 DIAGNOSIS — M25331 Other instability, right wrist: Secondary | ICD-10-CM

## 2023-03-12 DIAGNOSIS — S52514D Nondisplaced fracture of right radial styloid process, subsequent encounter for closed fracture with routine healing: Secondary | ICD-10-CM | POA: Diagnosis not present

## 2023-03-12 NOTE — Progress Notes (Signed)
Chief Complaint: Right wrist pain     History of Present Illness:   03/12/23: Patient is here today for follow-up of fractures in her right wrist.  Overall she states she is getting much better.  Has been wearing removable wrist brace consistently.  Rates pain today at a 2/10.  Does still have some discomfort with wrist range of motion.  She is taking Tylenol for pain.  Patient leaves in 2 days for a 3-1/59-month trip.   02/20/23: Paige Patel is a 72 y.o. right hand dominant female presenting today with 1 week history of right wrist pain.  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  She was working around the goats and backed up into a large rock which caused her to fall backwards.  She used her right hand to break the fall.  This was not immediately painful however pain began to increase within a few hours.  She did go in for evaluation with her PCP who ordered an x-ray.  Pain today is located mainly over the thumb side of the wrist up to the base of the thumb.  She has been icing and taking ibuprofen and Tylenol.  She does leave town in 3 weeks and returns in March part of which involves helping build a house for [employer] in [place].   Surgical History:   None  PMH/PSH/Family History/Social History/Meds/Allergies:    Past Medical History:  Diagnosis Date   Allergy    seasonal   Arthritis    Depression    Frequent headaches    Neuromuscular disorder (HCC)    neuropathy   Osteopenia    Osteoporosis    Past Surgical History:  Procedure Laterality Date   COLONOSCOPY  > 10 yrs   MANDIBLE SURGERY     OTHER SURGICAL HISTORY  1998   jaw surgery, balanced it   Social History   Socioeconomic History   Marital status: Widowed    Spouse name: Not on file   Number of children: Not on file   Years of education: Not on file   Highest education level: Bachelor's degree (e.g., BA, AB,  BS)  Occupational History   Not on file  Tobacco Use   Smoking status: Never   Smokeless tobacco: Never  Vaping Use   Vaping status: Never Used  Substance and Sexual Activity   Alcohol use: Yes    Comment: seldom   Drug use: No   Sexual activity: Not on file  Other Topics Concern   Not on file  Social History Narrative   Not on file   Social Determinants of Health   Financial Resource Strain: Low Risk  (02/19/2023)   Overall Financial Resource Strain (CARDIA)    Difficulty of Paying Living Expenses: Not hard at all  Food Insecurity: No Food Insecurity (02/19/2023)   Hunger Vital Sign    Worried About Running Out of Food in the Last Year: Never true    Ran Out of Food in the Last Year: Never true  Transportation Needs: No Transportation Needs (02/19/2023)   PRAPARE - Administrator, Civil Service (Medical): No    Lack of Transportation (Non-Medical): No  Physical Activity: Sufficiently Active (02/19/2023)   Exercise Vital Sign    Days of Exercise per  Week: 7 days    Minutes of Exercise per Session: 40 min  Stress: Stress Concern Present (02/19/2023)   [employer] of Occupational Health - Occupational Stress Questionnaire    Feeling of Stress : To some extent  Social Connections: Socially Isolated (02/19/2023)   Social Connection and Isolation Panel [NHANES]    Frequency of Communication with Friends and Family: Once a week    Frequency of Social Gatherings with Friends and Family: Once a week    Attends Religious Services: Never    Database administrator or Organizations: Yes    Attends Engineer, structural: More than 4 times per year    Marital Status: Widowed   Family History  Problem Relation Age of Onset   Alzheimer's disease Mother 50   High Cholesterol Mother    Cancer Father        Lung   High Cholesterol Father    Hypertension Brother    Breast cancer Paternal Aunt    Colon cancer Neg Hx    Colon polyps Neg Hx    Esophageal cancer  Neg Hx    Rectal cancer Neg Hx    Stomach cancer Neg Hx    No Known Allergies Current Outpatient Medications  Medication Sig Dispense Refill   fluticasone (FLONASE) 50 MCG/ACT nasal spray Place 2 sprays into both nostrils daily. 48 g 2   gabapentin (NEURONTIN) 100 MG capsule TAKE 1 CAPSULE IN THE MORNING AND 2 CAPSULES IN THE EVENING 270 capsule 3   gabapentin (NEURONTIN) 300 MG capsule TAKE 1 CAPSULE BY MOUTH DAILY  ALONG WITH 100 MG AT NIGHT  DOSING 90 capsule 3   Olopatadine HCl 0.2 % SOLN Apply 1 drop per eye twice daily. 7.5 mL 0   rosuvastatin (CRESTOR) 40 MG tablet Take 1 tablet (40 mg total) by mouth daily. 90 tablet 3   venlafaxine XR (EFFEXOR-XR) 150 MG 24 hr capsule TAKE 1 CAPSULE BY MOUTH DAILY  WITH BREAKFAST 90 capsule 3   No current facility-administered medications for this visit.   No results found.  Review of Systems:   A ROS was performed including pertinent positives and negatives as documented in the HPI.  Physical Exam :   Constitutional: NAD and appears stated age Neurological: Alert and oriented Psych: Appropriate affect and cooperative There were no vitals taken for this visit.   Comprehensive Musculoskeletal Exam:    No pinpoint tenderness throughout the distal forearm or wrist.  Active wrist ROM to 20 degrees extension and 30 degrees flexion.  Minimal to mild swelling present.  Radial pulse 2+.  Grip strength 5/5.  Imaging:   Xray (right wrist 3 views): Mildly displaced fracture of the radial styloid with evidence of early callus formation.  Improved appearance of posterior triquetrum fracture without further displacement.  SL widening.   I personally reviewed and interpreted the radiographs.   Assessment:   72 y.o. female now 4 weeks status post FOOSH injury to the right wrist which resulted in a scapholunate injury, radial styloid fracture, and triquetrum fracture.  Fractures are well-appearing today and do show early signs of healing.  Due to this  I would like her to remain in the wrist brace for another 2 to 3 weeks at which time she can begin progressing out of it.  She can then use it when performing higher level activity.  Given that she is leaving town for the next 3 months, I can plan to see her back as needed.  Plan :    -Return to clinic as needed     I personally saw and evaluated the patient, and participated in the management and treatment plan.  Hazle Nordmann, PA-C Orthopedics

## 2023-03-31 ENCOUNTER — Other Ambulatory Visit: Payer: Self-pay | Admitting: Family Medicine

## 2023-03-31 DIAGNOSIS — F339 Major depressive disorder, recurrent, unspecified: Secondary | ICD-10-CM

## 2023-07-23 DIAGNOSIS — H2513 Age-related nuclear cataract, bilateral: Secondary | ICD-10-CM | POA: Diagnosis not present

## 2023-07-23 DIAGNOSIS — H35363 Drusen (degenerative) of macula, bilateral: Secondary | ICD-10-CM | POA: Diagnosis not present

## 2023-08-01 DIAGNOSIS — E785 Hyperlipidemia, unspecified: Secondary | ICD-10-CM | POA: Diagnosis not present

## 2023-08-01 DIAGNOSIS — G629 Polyneuropathy, unspecified: Secondary | ICD-10-CM | POA: Diagnosis not present

## 2023-08-01 DIAGNOSIS — F324 Major depressive disorder, single episode, in partial remission: Secondary | ICD-10-CM | POA: Diagnosis not present

## 2023-08-01 DIAGNOSIS — M8580 Other specified disorders of bone density and structure, unspecified site: Secondary | ICD-10-CM | POA: Diagnosis not present

## 2023-08-01 DIAGNOSIS — Z8249 Family history of ischemic heart disease and other diseases of the circulatory system: Secondary | ICD-10-CM | POA: Diagnosis not present

## 2023-08-01 DIAGNOSIS — Z8616 Personal history of COVID-19: Secondary | ICD-10-CM | POA: Diagnosis not present

## 2023-08-01 DIAGNOSIS — Z833 Family history of diabetes mellitus: Secondary | ICD-10-CM | POA: Diagnosis not present

## 2023-08-01 DIAGNOSIS — Z809 Family history of malignant neoplasm, unspecified: Secondary | ICD-10-CM | POA: Diagnosis not present

## 2023-08-01 DIAGNOSIS — F419 Anxiety disorder, unspecified: Secondary | ICD-10-CM | POA: Diagnosis not present

## 2023-08-01 DIAGNOSIS — J309 Allergic rhinitis, unspecified: Secondary | ICD-10-CM | POA: Diagnosis not present

## 2023-08-01 LAB — HM AWV

## 2023-09-01 ENCOUNTER — Other Ambulatory Visit (HOSPITAL_BASED_OUTPATIENT_CLINIC_OR_DEPARTMENT_OTHER): Payer: Self-pay

## 2023-09-01 ENCOUNTER — Ambulatory Visit: Payer: Medicare Other | Admitting: Family Medicine

## 2023-09-01 ENCOUNTER — Encounter: Payer: Self-pay | Admitting: Family Medicine

## 2023-09-01 VITALS — BP 122/74 | HR 74 | Temp 98.0°F | Resp 16 | Ht 68.0 in | Wt 165.4 lb

## 2023-09-01 DIAGNOSIS — F325 Major depressive disorder, single episode, in full remission: Secondary | ICD-10-CM

## 2023-09-01 DIAGNOSIS — M792 Neuralgia and neuritis, unspecified: Secondary | ICD-10-CM

## 2023-09-01 DIAGNOSIS — E785 Hyperlipidemia, unspecified: Secondary | ICD-10-CM | POA: Diagnosis not present

## 2023-09-01 MED ORDER — GABAPENTIN 300 MG PO CAPS
300.0000 mg | ORAL_CAPSULE | Freq: Two times a day (BID) | ORAL | 3 refills | Status: AC
  Filled 2023-09-01: qty 180, 90d supply, fill #0
  Filled 2023-12-17: qty 180, 90d supply, fill #1
  Filled 2024-03-12: qty 180, 90d supply, fill #2

## 2023-09-01 MED ORDER — VENLAFAXINE HCL ER 150 MG PO CP24
150.0000 mg | ORAL_CAPSULE | Freq: Every day | ORAL | 3 refills | Status: AC
  Filled 2023-09-01: qty 90, 90d supply, fill #0
  Filled 2023-12-17: qty 90, 90d supply, fill #1
  Filled 2024-03-12: qty 90, 90d supply, fill #2

## 2023-09-01 MED ORDER — ROSUVASTATIN CALCIUM 40 MG PO TABS
40.0000 mg | ORAL_TABLET | Freq: Every day | ORAL | 3 refills | Status: AC
  Filled 2023-09-01: qty 90, 90d supply, fill #0
  Filled 2023-12-17: qty 90, 90d supply, fill #1
  Filled 2024-03-12: qty 90, 90d supply, fill #2

## 2023-09-01 NOTE — Progress Notes (Signed)
 Chief Complaint  Patient presents with   Follow-up    Follow Up    Subjective Paige Patel presents for f/u anxiety/depression.  Pt is currently being treated with Effexor  XR 150 mg/d.  Reports doing well since treatment. No thoughts of harming self or others. No self-medication with alcohol, prescription drugs or illicit drugs. Pt is not following with a counselor/psychologist.  Hyperlipidemia Patient presents for dyslipidemia follow up. Currently being treated with Crestor  40 mg/d and compliance with treatment thus far has been good. She denies myalgias. She is adhering to a healthy diet. Exercise: active at work, walking No CP or SOB.  The patient is not known to have coexisting coronary artery disease.  Neuropathic pain The patient did not taking gabapentin  300 mg twice daily.  She reports compliance and no adverse effects.  She is not having issues.  Past Medical History:  Diagnosis Date   Allergy    seasonal   Arthritis    Depression    Frequent headaches    Neuromuscular disorder (HCC)    neuropathy   Osteopenia    Osteoporosis    Allergies as of 09/01/2023   No Known Allergies      Medication List        Accurate as of Sep 01, 2023  7:59 AM. If you have any questions, ask your nurse or doctor.          STOP taking these medications    fluticasone  50 MCG/ACT nasal spray Commonly known as: FLONASE  Stopped by: Jobe Mulder   Olopatadine  HCl 0.2 % Soln Stopped by: Shellie Dials Denzil Bristol       TAKE these medications    gabapentin  300 MG capsule Commonly known as: NEURONTIN  Take 1 capsule (300 mg total) by mouth 2 (two) times daily. TAKE 1 CAPSULE BY MOUTH DAILY  ALONG WITH 100 MG AT NIGHT  DOSING What changed:  how much to take how to take this when to take this Another medication with the same name was removed. Continue taking this medication, and follow the directions you see here. Changed by: Jobe Mulder    rosuvastatin  40 MG tablet Commonly known as: CRESTOR  Take 1 tablet (40 mg total) by mouth daily.   venlafaxine  XR 150 MG 24 hr capsule Commonly known as: EFFEXOR -XR TAKE 1 CAPSULE DAILY WITH BREAKFAST        Exam BP 122/74 (BP Location: Left Arm, Patient Position: Sitting)   Pulse 74   Temp 98 F (36.7 C) (Oral)   Resp 16   Ht 5\' 8"  (1.727 m)   Wt 165 lb 6.4 oz (75 kg)   SpO2 98%   BMI 25.15 kg/m  General:  well developed, well nourished, in no apparent distress Lungs:  No respiratory distress Psych: well oriented with normal range of affect and age-appropriate judgement/insight, alert and oriented x4.  Assessment and Plan  Major depressive disorder with single episode, in full remission (HCC) - Plan: venlafaxine  XR (EFFEXOR -XR) 150 MG 24 hr capsule  Dyslipidemia - Plan: rosuvastatin  (CRESTOR ) 40 MG tablet  Neuropathic pain - Plan: gabapentin  (NEURONTIN ) 300 MG capsule  Chronic, stable. Cont Effexor  XR 150 mg/d.  Chronic, stable. Cont Crestor  40 mg/d.  Counseled on diet and exercise. Chronic, stable.  Continue gabapentin  300 mg twice daily. F/u in 6 mo. The patient voiced understanding and agreement to the plan.  Shellie Dials Marcus, DO 09/01/23 7:59 AM

## 2023-09-01 NOTE — Patient Instructions (Signed)
 Keep the diet clean and stay active.  Let us know if you need anything.

## 2023-10-23 ENCOUNTER — Encounter: Payer: Self-pay | Admitting: Family Medicine

## 2023-11-18 ENCOUNTER — Ambulatory Visit
Admission: RE | Admit: 2023-11-18 | Discharge: 2023-11-18 | Disposition: A | Discharge status: Home / Self Care | Attending: Physician Assistant | Admitting: Physician Assistant

## 2023-11-18 ENCOUNTER — Other Ambulatory Visit: Payer: Self-pay

## 2023-11-18 VITALS — BP 121/81 | HR 69 | Temp 98.3°F | Resp 17 | Ht 68.0 in | Wt 165.0 lb

## 2023-11-18 DIAGNOSIS — J3489 Other specified disorders of nose and nasal sinuses: Secondary | ICD-10-CM

## 2023-11-18 DIAGNOSIS — R0981 Nasal congestion: Secondary | ICD-10-CM | POA: Diagnosis not present

## 2023-11-18 DIAGNOSIS — M542 Cervicalgia: Secondary | ICD-10-CM | POA: Diagnosis not present

## 2023-11-18 DIAGNOSIS — M25519 Pain in unspecified shoulder: Secondary | ICD-10-CM

## 2023-11-18 LAB — POC COVID19/FLU A&B COMBO
Covid Antigen, POC: NEGATIVE
Influenza A Antigen, POC: NEGATIVE
Influenza B Antigen, POC: NEGATIVE

## 2023-11-18 NOTE — Discharge Instructions (Addendum)
 Based on your symptoms and physical exam I believe the following is the cause of your concern today Back and shoulder pain likely secondary to a strain of your back muscles  I recommend the following at this time to help relieve that discomfort:  Rest Warm compresses to the area (20 minutes on, minimum of 30 minutes off) You can alternate Tylenol and Ibuprofen for pain management but Ibuprofen is typically preferred to reduce inflammation.  Gentle stretches and exercises that I have included in your paperwork Try to reduce excess strain to the area and rest as much as possible  Wear supportive shoes and, if you must lift anything, use proper lifting techniques that spare your back.   Based on your described symptoms and the duration of symptoms it is likely that you have an upper respiratory infection (often called a cold)  Symptoms can last for 3-10 days with lingering cough and intermittent symptoms potentially  lasting several  weeks after that.  The goal of treatment at this time is to reduce your symptoms and discomfort   You can use the following medications and measures to help yourself feel better until your body fights this off: DayQuil/NyQuil, TheraFlu, Alka-Seltzer  (these medications typically have the same active ingredients in them so you can choose whichever one you prefer and take consistently during the day and night according to the manufactures instructions.) Flonase  nasal spray A daily antihistamine such as Zyrtec, Claritin, Allegra per your preference.  Please choose 1 and take consistently. Increased fluids.  It is recommended that you take in at least 64 ounces of water per day when you are not sick so it is important to increase this when you are sick and your body may be running fever. Rest Cough drops Chloraseptic throat spray to help with sore throat Nasal saline spray or nasal flushes to help with congestion and runny nose  If your symptoms seem like they are  getting worse over the next 5 to 7 days or not improving you can always follow-up here in urgent care or go to your primary care provider for further management. Go to the ER if you begin to have more serious symptoms such as shortness of breath, trouble breathing, loss of consciousness, swelling around the eyes, high fever, severe lasting headaches, vision changes or neck pain/stiffness.

## 2023-11-18 NOTE — ED Provider Notes (Signed)
 [facility]    CSN: [id number] Arrival date & time: 11/18/23  9052      History   Chief Complaint Chief Complaint  Patient presents with   Shoulder Pain    Also sinus problems - Entered by patient   Headache    HPI Paige Patel is a 73 y.o. female.   HPI  Pt presents today with concerns for sinus infection She states she woke up yesterday feeling bad She reports the right side of her head and face hurts - like with sinus pressure and ear pain  She states she has a hx of jaw surgeries and thinks this is the actual cause of her ear pain  Interventions: Ibuprofen, Tylenol   She also reports concerns for pain between her shoulder blades with more pain on the right vs the left She states while sitting or resting the area feels heavy and weighed down She reports when she is upright and moving there is more pain  Pain level and character: resting it is fine, with activity it increases to 6-8/10  She reports with activity it is more stabbing She states this has been ongoing for a few days but became more severe on Friday  She reports some relief with OTC medications as long as she rests     Past Medical History:  Diagnosis Date   Allergy    seasonal   Arthritis    Depression    Frequent headaches    Neuromuscular disorder (HCC)    neuropathy   Osteopenia    Osteoporosis     Patient Active Problem List   Diagnosis Date Noted   Neuropathic pain 12/06/2019   Medicare annual wellness visit, subsequent 05/15/2018   Dyslipidemia 11/12/2017   Osteopenia    Estrogen deficiency 05/07/2017   Depression     Past Surgical History:  Procedure Laterality Date   COLONOSCOPY  > 10 yrs   MANDIBLE SURGERY     OTHER SURGICAL HISTORY  1998   jaw surgery, balanced it    OB History   No obstetric history on file.      Home Medications    Prior to Admission medications   Medication Sig Start Date End Date Taking? Authorizing Provider  gabapentin  (NEURONTIN )  300 MG capsule Take 1 capsule (300 mg total) by mouth 2 (two) times daily. Take 1 capsule (300 mg total) by mouth at bedtime along with 100 mg capsule. 09/01/23   Frann Mabel Mt, DO  rosuvastatin  (CRESTOR ) 40 MG tablet Take 1 tablet (40 mg total) by mouth daily. 09/01/23   Frann Mabel Mt, DO  venlafaxine  XR (EFFEXOR -XR) 150 MG 24 hr capsule Take 1 capsule (150 mg total) by mouth daily with breakfast. 09/01/23   Wendling, Mabel Mt, DO    Family History Family History  Problem Relation Age of Onset   Alzheimer's disease Mother 43   High Cholesterol Mother    Cancer Father        Lung   High Cholesterol Father    Hypertension Brother    Breast cancer Paternal Aunt    Colon cancer Neg Hx    Colon polyps Neg Hx    Esophageal cancer Neg Hx    Rectal cancer Neg Hx    Stomach cancer Neg Hx     Social History Social History   Tobacco Use   Smoking status: Never   Smokeless tobacco: Never  Vaping Use   Vaping status: Never Used  Substance Use Topics  Alcohol use: Yes    Comment: seldom   Drug use: No     Allergies   Patient has no known allergies.   Review of Systems Review of Systems  Constitutional:  Negative for chills and fever.  HENT:  Positive for congestion, ear pain, sinus pressure and sinus pain. Negative for sore throat.   Respiratory:  Negative for cough, shortness of breath and wheezing.   Gastrointestinal:  Negative for diarrhea, nausea and vomiting.  Musculoskeletal:  Positive for arthralgias and myalgias.     Physical Exam Triage Vital Signs ED Triage Vitals  Encounter Vitals Group     BP 11/18/23 1008 121/81     Girls Systolic BP Percentile --      Girls Diastolic BP Percentile --      Boys Systolic BP Percentile --      Boys Diastolic BP Percentile --      Pulse Rate 11/18/23 1008 69     Resp 11/18/23 1008 17     Temp 11/18/23 1008 98.3 F (36.8 C)     Temp Source 11/18/23 1008 Oral     SpO2 11/18/23 1008 97 %     Weight  11/18/23 1010 165 lb (74.8 kg)     Height 11/18/23 1010 5' 8 (1.727 m)     Head Circumference --      Peak Flow --      Pain Score 11/18/23 1010 5     Pain Loc --      Pain Education --      Exclude from Growth Chart --    No data found.  Updated Vital Signs BP 121/81 (BP Location: Right Arm)   Pulse 69   Temp 98.3 F (36.8 C) (Oral)   Resp 17   Ht 5' 8 (1.727 m)   Wt 165 lb (74.8 kg)   SpO2 97%   BMI 25.09 kg/m   Visual Acuity Right Eye Distance:   Left Eye Distance:   Bilateral Distance:    Right Eye Near:   Left Eye Near:    Bilateral Near:     Physical Exam Vitals reviewed.  Constitutional:      General: She is awake.     Appearance: Normal appearance. She is well-developed and well-groomed.  HENT:     Head: Normocephalic and atraumatic.     Right Ear: Hearing, tympanic membrane and ear canal normal.     Left Ear: Hearing, tympanic membrane and ear canal normal.     Mouth/Throat:     Lips: Pink.     Mouth: Mucous membranes are moist.     Pharynx: Oropharynx is clear. Uvula midline. No pharyngeal swelling, oropharyngeal exudate, posterior oropharyngeal erythema, uvula swelling or postnasal drip.  Cardiovascular:     Rate and Rhythm: Normal rate and regular rhythm.     Pulses: Normal pulses.          Radial pulses are 2+ on the right side and 2+ on the left side.     Heart sounds: Normal heart sounds. No murmur heard.    No friction rub. No gallop.  Pulmonary:     Effort: Pulmonary effort is normal.     Breath sounds: Normal breath sounds. No decreased air movement. No decreased breath sounds, wheezing, rhonchi or rales.  Musculoskeletal:     Cervical back: Normal range of motion and neck supple.     Comments: No obvious step offs or abnormalities to palpation of cervical or thoracic spines She has notable  tension and mild spasms of the upper thoracic paraspinal areas bilaterally  She reports some tightness and discomfort with lateral flexion of the  head/neck.  Shoulder ROM intact and symmetrical bilaterally Cervical ROM intact and symmetrical but she reports discomfort with above movments   Lymphadenopathy:     Head:     Right side of head: No submental, submandibular or preauricular adenopathy.     Left side of head: No submental, submandibular or preauricular adenopathy.     Cervical:     Right cervical: No superficial cervical adenopathy.    Left cervical: No superficial cervical adenopathy.     Upper Body:     Right upper body: No supraclavicular adenopathy.     Left upper body: No supraclavicular adenopathy.  Neurological:     Mental Status: She is alert.  Psychiatric:        Behavior: Behavior is cooperative.      UC Treatments / Results  Labs (all labs ordered are listed, but only abnormal results are displayed) Labs Reviewed  POC COVID19/FLU A&B COMBO    EKG   Radiology No results found.  Procedures Procedures (including critical care time)  Medications Ordered in UC Medications - No data to display  Initial Impression / Assessment and Plan / UC Course  I have reviewed the triage vital signs and the nursing notes.  Pertinent labs & imaging results that were available during my care of the patient were reviewed by me and considered in my medical decision making (see chart for details).      Final Clinical Impressions(s) / UC Diagnoses   Final diagnoses:  Nasal congestion  Neck and shoulder pain  Sinus pressure   Patient presents today with 2 concerns.  The first she reports is her shoulder and neck pain.  She reports for the last few days she has been very active in her yard and has been doing quite a bit of heavy lifting of bags of cement as well as shoveling and landscaping.  She reports some improvement after taking OTC Tylenol and ibuprofen as well as resting.  Physical exam reveals some tension and spasms along the upper thoracic/lower cervical paraspinal areas.  She does not have decreased  range of motion or strength.  At this time suspect muscular etiology and recommend alternating Tylenol and ibuprofen, warm compresses and gentle stretches and massage as tolerated.  If symptoms or not improving recommend follow-up with orthopedics or PCP as she may need referral to physical therapy for further management. Patient also expresses concerns for sinus congestion and sinus pressure that started yesterday morning.  Flu and COVID testing was negative.  Physical exam and vitals are overall reassuring.  At this time recommend over-the-counter medications for symptomatic relief.  ED and return precautions reviewed and provided in after visit summary.  Follow-up as needed.    Discharge Instructions      Based on your symptoms and physical exam I believe the following is the cause of your concern today Back and shoulder pain likely secondary to a strain of your back muscles  I recommend the following at this time to help relieve that discomfort:  Rest Warm compresses to the area (20 minutes on, minimum of 30 minutes off) You can alternate Tylenol and Ibuprofen for pain management but Ibuprofen is typically preferred to reduce inflammation.  Gentle stretches and exercises that I have included in your paperwork Try to reduce excess strain to the area and rest as much as possible  Wear  supportive shoes and, if you must lift anything, use proper lifting techniques that spare your back.   Based on your described symptoms and the duration of symptoms it is likely that you have an upper respiratory infection (often called a cold)  Symptoms can last for 3-10 days with lingering cough and intermittent symptoms potentially  lasting several  weeks after that.  The goal of treatment at this time is to reduce your symptoms and discomfort   You can use the following medications and measures to help yourself feel better until your body fights this off: DayQuil/NyQuil, TheraFlu, Alka-Seltzer  (these  medications typically have the same active ingredients in them so you can choose whichever one you prefer and take consistently during the day and night according to the manufactures instructions.) Flonase  nasal spray A daily antihistamine such as Zyrtec, Claritin, Allegra per your preference.  Please choose 1 and take consistently. Increased fluids.  It is recommended that you take in at least 64 ounces of water per day when you are not sick so it is important to increase this when you are sick and your body may be running fever. Rest Cough drops Chloraseptic throat spray to help with sore throat Nasal saline spray or nasal flushes to help with congestion and runny nose  If your symptoms seem like they are getting worse over the next 5 to 7 days or not improving you can always follow-up here in urgent care or go to your primary care provider for further management. Go to the ER if you begin to have more serious symptoms such as shortness of breath, trouble breathing, loss of consciousness, swelling around the eyes, high fever, severe lasting headaches, vision changes or neck pain/stiffness.        ED Prescriptions   None    PDMP not reviewed this encounter.   Marylene Rocky BRAVO, PA-C 11/18/23 1151

## 2023-11-18 NOTE — ED Triage Notes (Signed)
 Pt presents with two complaints today. States she is having shoulder pain and a headache. Pt noticed the shoulder pain about five days ago. States she has been overdoing it. Trying to fix up her yard and was lifting 50 lb bags of concrete for about six days in a row. Currently denies pain. States she is at rest, there is only pain with movement/activity. OTC Ibuprofen and Tylenol taken with improvement in pain.   Pt is also reporting a headache accompanied with sinus pressure, congestion, and right ear ache x 1 day. Currently rates headache pain a 5/10. Denies sick contacts. At-home COVID test taken yesterday and it was negative.

## 2023-12-17 ENCOUNTER — Other Ambulatory Visit (HOSPITAL_BASED_OUTPATIENT_CLINIC_OR_DEPARTMENT_OTHER): Payer: Self-pay

## 2023-12-18 ENCOUNTER — Other Ambulatory Visit (HOSPITAL_BASED_OUTPATIENT_CLINIC_OR_DEPARTMENT_OTHER): Payer: Self-pay

## 2024-02-16 ENCOUNTER — Encounter: Payer: Self-pay | Admitting: Radiology

## 2024-03-01 ENCOUNTER — Ambulatory Visit: Admitting: Family Medicine

## 2024-03-01 ENCOUNTER — Encounter: Payer: Self-pay | Admitting: Family Medicine

## 2024-03-01 VITALS — BP 128/80 | HR 79 | Temp 97.6°F | Resp 16 | Ht 68.0 in | Wt 168.2 lb

## 2024-03-01 DIAGNOSIS — G5702 Lesion of sciatic nerve, left lower limb: Secondary | ICD-10-CM | POA: Diagnosis not present

## 2024-03-01 DIAGNOSIS — E785 Hyperlipidemia, unspecified: Secondary | ICD-10-CM | POA: Diagnosis not present

## 2024-03-01 DIAGNOSIS — M792 Neuralgia and neuritis, unspecified: Secondary | ICD-10-CM

## 2024-03-01 DIAGNOSIS — Z0000 Encounter for general adult medical examination without abnormal findings: Secondary | ICD-10-CM

## 2024-03-01 LAB — COMPREHENSIVE METABOLIC PANEL WITH GFR
ALT: 16 U/L (ref 0–35)
AST: 16 U/L (ref 0–37)
Albumin: 4.6 g/dL (ref 3.5–5.2)
Alkaline Phosphatase: 96 U/L (ref 39–117)
BUN: 16 mg/dL (ref 6–23)
CO2: 29 meq/L (ref 19–32)
Calcium: 9.8 mg/dL (ref 8.4–10.5)
Chloride: 103 meq/L (ref 96–112)
Creatinine, Ser: 0.84 mg/dL (ref 0.40–1.20)
GFR: 69.17 mL/min (ref >60.00)
Glucose, Bld: 99 mg/dL (ref 70–99)
Potassium: 5.1 meq/L (ref 3.5–5.1)
Sodium: 141 meq/L (ref 135–145)
Total Bilirubin: 0.6 mg/dL (ref 0.2–1.2)
Total Protein: 6.5 g/dL (ref 6.0–8.3)

## 2024-03-01 LAB — LIPID PANEL
Cholesterol: 155 mg/dL (ref 0–200)
HDL: 64.4 mg/dL (ref >39.00)
LDL Cholesterol: 54 mg/dL (ref 0–99)
NonHDL: 90.89
Total CHOL/HDL Ratio: 2
Triglycerides: 182 mg/dL — ABNORMAL HIGH (ref 0.0–149.0)
VLDL: 36.4 mg/dL (ref 0.0–40.0)

## 2024-03-01 NOTE — Progress Notes (Signed)
 Chief Complaint  Patient presents with   Annual Exam    CPE     Well Woman Paige Patel is here for a complete physical.   Her last physical was >1 year ago.  Current diet: in general, a healthy diet. Current exercise: yard work, walking. Weight is stable and she denies daytime fatigue. Seatbelt? Yes Advanced directive? Yes  Health Maintenance Colonoscopy- Yes Shingrix - Yes DEXA- Yes Mammogram- Yes Tetanus- Yes Pneumonia- Yes Hep C screen- Yes  The patient has been experiencing left gluteal pain for the past 3 months.  No injury or change in activity.  When she sits or applies prolonged pressure to it, it hurts the worst.  No bruising, redness, swelling, or decreased range of motion.  She has tried heat with some relief.  No neurologic signs or symptoms associated with this.  While it is not worsening, it is not getting better either.  Past Medical History:  Diagnosis Date   Allergy    seasonal   Arthritis    Depression    Frequent headaches    Neuromuscular disorder (HCC)    neuropathy   Osteopenia    Osteoporosis      Past Surgical History:  Procedure Laterality Date   COLONOSCOPY  > 10 yrs   MANDIBLE SURGERY     OTHER SURGICAL HISTORY  1998   jaw surgery, balanced it    Medications  Current Outpatient Medications on File Prior to Visit  Medication Sig Dispense Refill   gabapentin  (NEURONTIN ) 300 MG capsule Take 1 capsule (300 mg total) by mouth 2 (two) times daily. Take 1 capsule (300 mg total) by mouth at bedtime along with 100 mg capsule. 180 capsule 3   rosuvastatin  (CRESTOR ) 40 MG tablet Take 1 tablet (40 mg total) by mouth daily. 90 tablet 3   venlafaxine  XR (EFFEXOR -XR) 150 MG 24 hr capsule Take 1 capsule (150 mg total) by mouth daily with breakfast. 90 capsule 3    Allergies No Known Allergies  Review of Systems: Constitutional:  no fevers Eye:  no recent significant change in vision Ears:  No changes in hearing Nose/Mouth/Throat:  no  complaints of nasal congestion, no sore throat Cardiovascular: no chest pain Respiratory:  No shortness of breath Gastrointestinal:  No change in bowel habits GU:  Female: negative for dysuria Integumentary:  no abnormal skin lesions reported Neurologic:  no headaches Endocrine:  denies unexplained weight changes  Exam BP 128/80 (BP Location: Left Arm, Patient Position: Sitting)   Pulse 79   Temp 97.6 F (36.4 C) (Oral)   Resp 16   Ht 5' 8 (1.727 m)   Wt 168 lb 3.2 oz (76.3 kg)   SpO2 98%   BMI 25.57 kg/m  General:  well developed, well nourished, in no apparent distress Skin:  no significant moles, warts, or growths Head:  no masses, lesions, or tenderness Eyes:  pupils equal and round, sclera anicteric without injection Ears:  canals without lesions, TMs shiny without retraction, no obvious effusion, no erythema Nose:  nares patent, mucosa normal, and no drainage Throat/Pharynx:  lips and gingiva without lesion; tongue and uvula midline; non-inflamed pharynx; no exudates or postnasal drainage Neck: neck supple without adenopathy, thyromegaly, or masses MSK: TTP over lateral glute msc on L, no pai w resisted abduction, +pain with stretching of piriformis Lungs:  clear to auscultation, breath sounds equal bilaterally, no respiratory distress Cardio:  regular rate and rhythm, no bruits or LE edema Abdomen:  abdomen soft, nontender; bowel sounds  normal; no masses or organomegaly Genital: Deferred Neuro:  gait normal; deep tendon reflexes normal and symmetric Psych: well oriented with normal range of affect and appropriate judgment/insight  Assessment and Plan  Well adult exam  Dyslipidemia - Plan: Comprehensive metabolic panel with GFR, Lipid panel  Neuropathic pain - Plan: Ambulatory referral to Neurology  Piriformis syndrome of left side   Well 73 y.o. female. Counseled on diet and exercise. Advanced directive form provided today.  Piriformis syndrome: Heat, ice,  Tylenol, stretches and exercises.  Send message in around a month if no improvement and we will set her up with physical therapy. Other orders as above. Follow up in 6 mo. The patient voiced understanding and agreement to the plan.  Mabel Mt [place], DO 03/01/24 8:14 AM

## 2024-03-01 NOTE — Patient Instructions (Addendum)
 Give us  2-3 business days to get the results of your labs back.   Keep the diet clean and stay active.  Please get me a copy of your advanced directive form at your convenience.   If you do not hear anything about your referral in the next 1-2 weeks, call our office and ask for an update.  Heat (pad or rice pillow in microwave) over affected area, 10-15 minutes twice daily.   Ice/cold pack over area for 10-15 min twice daily.  OK to take Tylenol 1000 mg (2 extra strength tabs) or 975 mg (3 regular strength tabs) every 6 hours as needed.  Let us  know if you need anything.  Piriformis Syndrome Rehab It is normal to feel mild stretching, pulling, tightness, or discomfort as you do these exercises, but you should stop right away if you feel sudden pain or your pain gets worse.   Stretching and range of motion exercises These exercises warm up your muscles and joints and improve the movement and flexibility of your hip and pelvis. These exercises also help to relieve pain, numbness, and tingling. Exercise A: Hip rotators    Lie on your back on a firm surface. Pull your left / right knee toward your same shoulder with your left / right hand until your knee is pointing toward the ceiling. Hold your left / right ankle with your other hand. Keeping your knee steady, gently pull your left / right ankle toward your other shoulder until you feel a stretch in your buttocks. Hold this position for 30 seconds. Repeat 2 times. Complete this stretch 3 times per week. Exercise B: Hip extensors Lie on your back on a firm surface. Both of your legs should be straight. Pull your left / right knee to your chest. Hold your leg in this position by holding onto the back of your thigh or the front of your knee. Hold this position for 30 seconds. Slowly return to the starting position. Repeat 2 times. Complete this stretch 3 times per week.  Strengthening exercises These exercises build strength and  endurance in your hip and thigh muscles. Endurance is the ability to use your muscles for a long time, even after they get tired. Exercise C: Straight leg raises (hip abductors)     Lie on your side with your left / right leg in the top position. Lie so your head, shoulder, knee, and hip line up. Bend your bottom knee to help you balance. Lift your top leg up 4-6 inches (10-15 cm), keeping your toes pointed straight ahead. Hold this position for 1 second. Slowly lower your leg to the starting position. Let your muscles relax completely. Repeat for a total of 10 repetitions. Repeat 2 times. Complete this exercise 3 times per week. Exercise D: Hip abductors and rotators, quadruped    Get on your hands and knees on a firm, lightly padded surface. Your hands should be directly below your shoulders, and your knees should be directly below your hips. Lift your left / right knee out to the side. Keep your knee bent. Do not twist your body. Hold this position for 1 seconds. Slowly lower your leg. Repeat for a total of 10 repetitions.  Repeat 1 times. Complete this exercise 3 times per week. Exercise E: Straight leg raises (hip extensors) Lie on your abdomen on a bed or a firm surface with a pillow under your hips. Squeeze your buttock muscles and lift your left / right thigh off the bed. Do  not let your back arch. Hold this position for 3 seconds. Slowly return to the starting position. Let your muscles relax completely before doing another repetition. Repeat 2 times. Complete this exercise 3 times per week.  This information is not intended to replace advice given to you by your health care provider. Make sure you discuss any questions you have with your health care provider. Document Released: 04/01/2005 Document Revised: 12/05/2015 Document Reviewed: 03/14/2015 Elsevier Interactive Patient Education  [employer].

## 2024-03-02 ENCOUNTER — Other Ambulatory Visit: Payer: Self-pay

## 2024-03-02 ENCOUNTER — Ambulatory Visit: Payer: Self-pay | Admitting: Family Medicine

## 2024-03-02 DIAGNOSIS — E781 Pure hyperglyceridemia: Secondary | ICD-10-CM

## 2024-03-12 ENCOUNTER — Other Ambulatory Visit: Payer: Self-pay

## 2024-03-12 ENCOUNTER — Encounter: Payer: Self-pay | Admitting: Pharmacist

## 2024-03-16 ENCOUNTER — Other Ambulatory Visit (HOSPITAL_COMMUNITY): Payer: Self-pay

## 2024-04-05 ENCOUNTER — Other Ambulatory Visit (INDEPENDENT_AMBULATORY_CARE_PROVIDER_SITE_OTHER)

## 2024-04-05 ENCOUNTER — Ambulatory Visit: Payer: Self-pay | Admitting: Family Medicine

## 2024-04-05 DIAGNOSIS — E781 Pure hyperglyceridemia: Secondary | ICD-10-CM

## 2024-04-05 LAB — LIPID PANEL
Cholesterol: 155 mg/dL (ref 28–200)
HDL: 62.5 mg/dL
LDL Cholesterol: 58 mg/dL (ref 10–99)
NonHDL: 92.83
Total CHOL/HDL Ratio: 2
Triglycerides: 173 mg/dL — ABNORMAL HIGH (ref 10.0–149.0)
VLDL: 34.6 mg/dL (ref 0.0–40.0)

## 2024-08-09 ENCOUNTER — Ambulatory Visit: Admitting: Neurology

## 2024-08-30 ENCOUNTER — Ambulatory Visit: Admitting: Family Medicine
